# Patient Record
Sex: Male | Born: 1940 | Race: White | Hispanic: No | Marital: Single | Smoking: Former smoker
Health system: Southern US, Community
[De-identification: ages and names within clinical notes are randomized; demographics above are authoritative.]

## PROBLEM LIST (undated history)

## (undated) DIAGNOSIS — C19 Malignant neoplasm of rectosigmoid junction: Secondary | ICD-10-CM

## (undated) DIAGNOSIS — G629 Polyneuropathy, unspecified: Secondary | ICD-10-CM

## (undated) DIAGNOSIS — J45909 Unspecified asthma, uncomplicated: Secondary | ICD-10-CM

## (undated) DIAGNOSIS — M1990 Unspecified osteoarthritis, unspecified site: Secondary | ICD-10-CM

## (undated) DIAGNOSIS — F32A Depression, unspecified: Secondary | ICD-10-CM

## (undated) DIAGNOSIS — F329 Major depressive disorder, single episode, unspecified: Secondary | ICD-10-CM

## (undated) HISTORY — DX: Depression, unspecified: F32.A

## (undated) HISTORY — DX: Polyneuropathy, unspecified: G62.9

## (undated) HISTORY — DX: Major depressive disorder, single episode, unspecified: F32.9

## (undated) HISTORY — DX: Unspecified osteoarthritis, unspecified site: M19.90

## (undated) HISTORY — PX: COLON RESECTION: SHX5231

## (undated) HISTORY — DX: Unspecified asthma, uncomplicated: J45.909

## (undated) HISTORY — PX: TOTAL HIP ARTHROPLASTY: SHX124

## (undated) HISTORY — PX: FETAL SURGERY FOR CONGENITAL HERNIA: SHX1618

## (undated) HISTORY — DX: Malignant neoplasm of rectosigmoid junction: C19

## (undated) HISTORY — PX: APPENDECTOMY: SHX54

---

## 2009-02-15 ENCOUNTER — Encounter: Payer: Self-pay | Admitting: Gastroenterology

## 2009-02-23 ENCOUNTER — Ambulatory Visit: Payer: Self-pay | Admitting: Internal Medicine

## 2009-02-23 ENCOUNTER — Inpatient Hospital Stay (HOSPITAL_COMMUNITY): Admission: EM | Admit: 2009-02-23 | Discharge: 2009-02-24 | Payer: Self-pay | Admitting: Emergency Medicine

## 2009-02-23 ENCOUNTER — Encounter (INDEPENDENT_AMBULATORY_CARE_PROVIDER_SITE_OTHER): Payer: Self-pay | Admitting: *Deleted

## 2009-02-26 ENCOUNTER — Inpatient Hospital Stay: Payer: Self-pay | Admitting: Internal Medicine

## 2009-03-08 ENCOUNTER — Emergency Department: Payer: Self-pay | Admitting: Emergency Medicine

## 2009-05-02 ENCOUNTER — Ambulatory Visit: Payer: Self-pay | Admitting: Oncology

## 2009-05-10 LAB — CBC WITH DIFFERENTIAL/PLATELET
BASO%: 0.5 % (ref 0.0–2.0)
Eosinophils Absolute: 0.2 10*3/uL (ref 0.0–0.5)
HCT: 44.7 % (ref 38.4–49.9)
LYMPH%: 27.6 % (ref 14.0–49.0)
MONO#: 0.5 10*3/uL (ref 0.1–0.9)
NEUT#: 5.4 10*3/uL (ref 1.5–6.5)
Platelets: 287 10*3/uL (ref 140–400)
RBC: 4.69 10*6/uL (ref 4.20–5.82)
WBC: 8.3 10*3/uL (ref 4.0–10.3)
lymph#: 2.3 10*3/uL (ref 0.9–3.3)

## 2009-05-10 LAB — COMPREHENSIVE METABOLIC PANEL
Alkaline Phosphatase: 58 U/L (ref 39–117)
BUN: 13 mg/dL (ref 6–23)
CO2: 24 mEq/L (ref 19–32)
Creatinine, Ser: 0.86 mg/dL (ref 0.40–1.50)
Glucose, Bld: 113 mg/dL — ABNORMAL HIGH (ref 70–99)
Total Bilirubin: 0.6 mg/dL (ref 0.3–1.2)
Total Protein: 6.5 g/dL (ref 6.0–8.3)

## 2009-05-10 LAB — LACTATE DEHYDROGENASE: LDH: 99 U/L (ref 94–250)

## 2009-05-16 ENCOUNTER — Ambulatory Visit: Admission: RE | Admit: 2009-05-16 | Discharge: 2009-06-29 | Payer: Self-pay | Admitting: Radiation Oncology

## 2009-05-18 ENCOUNTER — Encounter (INDEPENDENT_AMBULATORY_CARE_PROVIDER_SITE_OTHER): Payer: Self-pay | Admitting: *Deleted

## 2009-05-18 ENCOUNTER — Ambulatory Visit (HOSPITAL_COMMUNITY): Admission: RE | Admit: 2009-05-18 | Discharge: 2009-05-18 | Payer: Self-pay | Admitting: Oncology

## 2009-05-22 ENCOUNTER — Ambulatory Visit (HOSPITAL_COMMUNITY): Admission: RE | Admit: 2009-05-22 | Discharge: 2009-05-22 | Payer: Self-pay | Admitting: Oncology

## 2009-05-22 ENCOUNTER — Encounter (INDEPENDENT_AMBULATORY_CARE_PROVIDER_SITE_OTHER): Payer: Self-pay | Admitting: *Deleted

## 2009-05-23 LAB — LACTATE DEHYDROGENASE: LDH: 139 U/L (ref 94–250)

## 2009-05-23 LAB — COMPREHENSIVE METABOLIC PANEL
ALT: 9 U/L (ref 0–53)
AST: 11 U/L (ref 0–37)
Alkaline Phosphatase: 62 U/L (ref 39–117)
CO2: 23 mEq/L (ref 19–32)
Creatinine, Ser: 0.74 mg/dL (ref 0.40–1.50)
Sodium: 138 mEq/L (ref 135–145)
Total Bilirubin: 0.5 mg/dL (ref 0.3–1.2)
Total Protein: 6.6 g/dL (ref 6.0–8.3)

## 2009-05-23 LAB — CBC WITH DIFFERENTIAL/PLATELET
Basophils Absolute: 0.1 10*3/uL (ref 0.0–0.1)
Eosinophils Absolute: 0.3 10*3/uL (ref 0.0–0.5)
HGB: 16.4 g/dL (ref 13.0–17.1)
LYMPH%: 23.3 % (ref 14.0–49.0)
MONO#: 0.6 10*3/uL (ref 0.1–0.9)
NEUT#: 5.9 10*3/uL (ref 1.5–6.5)
Platelets: 213 10*3/uL (ref 140–400)
RBC: 5.13 10*6/uL (ref 4.20–5.82)
RDW: 13.9 % (ref 11.0–14.6)
WBC: 8.9 10*3/uL (ref 4.0–10.3)

## 2009-05-30 LAB — CBC WITH DIFFERENTIAL/PLATELET
BASO%: 0.6 % (ref 0.0–2.0)
Basophils Absolute: 0 10*3/uL (ref 0.0–0.1)
EOS%: 8.5 % — ABNORMAL HIGH (ref 0.0–7.0)
HCT: 47.6 % (ref 38.4–49.9)
HGB: 16.7 g/dL (ref 13.0–17.1)
LYMPH%: 34.5 % (ref 14.0–49.0)
MCH: 32 pg (ref 27.2–33.4)
MCHC: 35.1 g/dL (ref 32.0–36.0)
MCV: 91.2 fL (ref 79.3–98.0)
NEUT%: 50 % (ref 39.0–75.0)
Platelets: 180 10*3/uL (ref 140–400)
lymph#: 2.5 10*3/uL (ref 0.9–3.3)

## 2009-06-04 ENCOUNTER — Ambulatory Visit: Payer: Self-pay | Admitting: Oncology

## 2009-06-06 LAB — COMPREHENSIVE METABOLIC PANEL
Albumin: 4.2 g/dL (ref 3.5–5.2)
Alkaline Phosphatase: 60 U/L (ref 39–117)
BUN: 11 mg/dL (ref 6–23)
Calcium: 9.3 mg/dL (ref 8.4–10.5)
Creatinine, Ser: 0.91 mg/dL (ref 0.40–1.50)
Glucose, Bld: 102 mg/dL — ABNORMAL HIGH (ref 70–99)
Potassium: 4.3 mEq/L (ref 3.5–5.3)

## 2009-06-06 LAB — CBC WITH DIFFERENTIAL/PLATELET
Basophils Absolute: 0.1 10*3/uL (ref 0.0–0.1)
Eosinophils Absolute: 0.2 10*3/uL (ref 0.0–0.5)
HGB: 16.9 g/dL (ref 13.0–17.1)
MONO#: 0.7 10*3/uL (ref 0.1–0.9)
NEUT#: 6.4 10*3/uL (ref 1.5–6.5)
Platelets: 184 10*3/uL (ref 140–400)
RBC: 5.22 10*6/uL (ref 4.20–5.82)
RDW: 14.1 % (ref 11.0–14.6)
WBC: 9.5 10*3/uL (ref 4.0–10.3)
nRBC: 0 % (ref 0–0)

## 2009-06-20 LAB — CBC WITH DIFFERENTIAL/PLATELET
BASO%: 0.3 % (ref 0.0–2.0)
Eosinophils Absolute: 0.1 10*3/uL (ref 0.0–0.5)
HCT: 44.4 % (ref 38.4–49.9)
LYMPH%: 30.4 % (ref 14.0–49.0)
MCHC: 35.2 g/dL (ref 32.0–36.0)
MONO#: 0.5 10*3/uL (ref 0.1–0.9)
NEUT#: 4.5 10*3/uL (ref 1.5–6.5)
NEUT%: 60.7 % (ref 39.0–75.0)
Platelets: 158 10*3/uL (ref 140–400)
RBC: 4.7 10*6/uL (ref 4.20–5.82)
WBC: 7.5 10*3/uL (ref 4.0–10.3)
lymph#: 2.3 10*3/uL (ref 0.9–3.3)

## 2009-06-20 LAB — COMPREHENSIVE METABOLIC PANEL
Alkaline Phosphatase: 63 U/L (ref 39–117)
BUN: 9 mg/dL (ref 6–23)
CO2: 24 mEq/L (ref 19–32)
Glucose, Bld: 115 mg/dL — ABNORMAL HIGH (ref 70–99)
Total Bilirubin: 0.4 mg/dL (ref 0.3–1.2)
Total Protein: 6.1 g/dL (ref 6.0–8.3)

## 2009-06-20 LAB — LACTATE DEHYDROGENASE: LDH: 135 U/L (ref 94–250)

## 2009-07-02 ENCOUNTER — Ambulatory Visit: Payer: Self-pay | Admitting: Oncology

## 2009-07-04 ENCOUNTER — Encounter (INDEPENDENT_AMBULATORY_CARE_PROVIDER_SITE_OTHER): Payer: Self-pay | Admitting: *Deleted

## 2009-07-04 LAB — CBC WITH DIFFERENTIAL/PLATELET
Basophils Absolute: 0 10*3/uL (ref 0.0–0.1)
Eosinophils Absolute: 0.1 10*3/uL (ref 0.0–0.5)
HCT: 44.1 % (ref 38.4–49.9)
HGB: 15.4 g/dL (ref 13.0–17.1)
LYMPH%: 28.3 % (ref 14.0–49.0)
MONO#: 0.4 10*3/uL (ref 0.1–0.9)
NEUT%: 61.3 % (ref 39.0–75.0)
Platelets: 90 10*3/uL — ABNORMAL LOW (ref 140–400)
WBC: 5.3 10*3/uL (ref 4.0–10.3)
lymph#: 1.5 10*3/uL (ref 0.9–3.3)

## 2009-07-10 LAB — COMPREHENSIVE METABOLIC PANEL
AST: 17 U/L (ref 0–37)
Albumin: 3.9 g/dL (ref 3.5–5.2)
BUN: 10 mg/dL (ref 6–23)
CO2: 24 mEq/L (ref 19–32)
Calcium: 9.5 mg/dL (ref 8.4–10.5)
Chloride: 102 mEq/L (ref 96–112)
Creatinine, Ser: 0.89 mg/dL (ref 0.40–1.50)
Glucose, Bld: 98 mg/dL (ref 70–99)
Potassium: 3.9 mEq/L (ref 3.5–5.3)

## 2009-07-10 LAB — CBC WITH DIFFERENTIAL/PLATELET
Eosinophils Absolute: 0.1 10*3/uL (ref 0.0–0.5)
MCV: 95.4 fL (ref 79.3–98.0)
MONO%: 11.3 % (ref 0.0–14.0)
NEUT#: 2.3 10*3/uL (ref 1.5–6.5)
RBC: 4.83 10*6/uL (ref 4.20–5.82)
RDW: 16 % — ABNORMAL HIGH (ref 11.0–14.6)
WBC: 4.6 10*3/uL (ref 4.0–10.3)
lymph#: 1.6 10*3/uL (ref 0.9–3.3)

## 2009-07-10 LAB — LACTATE DEHYDROGENASE: LDH: 140 U/L (ref 94–250)

## 2009-07-24 LAB — CBC WITH DIFFERENTIAL/PLATELET
Basophils Absolute: 0 10*3/uL (ref 0.0–0.1)
EOS%: 1.6 % (ref 0.0–7.0)
Eosinophils Absolute: 0.1 10*3/uL (ref 0.0–0.5)
HGB: 15.8 g/dL (ref 13.0–17.1)
LYMPH%: 25.9 % (ref 14.0–49.0)
MCH: 34.3 pg — ABNORMAL HIGH (ref 27.2–33.4)
MCV: 95.7 fL (ref 79.3–98.0)
MONO%: 8.3 % (ref 0.0–14.0)
NEUT#: 4 10*3/uL (ref 1.5–6.5)
Platelets: 115 10*3/uL — ABNORMAL LOW (ref 140–400)
RDW: 16.7 % — ABNORMAL HIGH (ref 11.0–14.6)

## 2009-07-24 LAB — COMPREHENSIVE METABOLIC PANEL
AST: 21 U/L (ref 0–37)
Albumin: 3.9 g/dL (ref 3.5–5.2)
Alkaline Phosphatase: 63 U/L (ref 39–117)
BUN: 11 mg/dL (ref 6–23)
Potassium: 4.1 mEq/L (ref 3.5–5.3)
Sodium: 137 mEq/L (ref 135–145)
Total Bilirubin: 0.8 mg/dL (ref 0.3–1.2)
Total Protein: 6.4 g/dL (ref 6.0–8.3)

## 2009-07-31 LAB — CBC WITH DIFFERENTIAL/PLATELET
BASO%: 0.5 % (ref 0.0–2.0)
EOS%: 2.9 % (ref 0.0–7.0)
LYMPH%: 49.9 % — ABNORMAL HIGH (ref 14.0–49.0)
MCH: 34.4 pg — ABNORMAL HIGH (ref 27.2–33.4)
MCHC: 35.3 g/dL (ref 32.0–36.0)
MCV: 97.3 fL (ref 79.3–98.0)
MONO%: 9 % (ref 0.0–14.0)
NEUT#: 2.1 10*3/uL (ref 1.5–6.5)
Platelets: 164 10*3/uL (ref 140–400)
RBC: 4.42 10*6/uL (ref 4.20–5.82)
RDW: 16.7 % — ABNORMAL HIGH (ref 11.0–14.6)

## 2009-08-01 ENCOUNTER — Ambulatory Visit: Payer: Self-pay | Admitting: Gastroenterology

## 2009-08-01 DIAGNOSIS — Z91013 Allergy to seafood: Secondary | ICD-10-CM | POA: Insufficient documentation

## 2009-08-01 DIAGNOSIS — R198 Other specified symptoms and signs involving the digestive system and abdomen: Secondary | ICD-10-CM

## 2009-08-01 DIAGNOSIS — Z85038 Personal history of other malignant neoplasm of large intestine: Secondary | ICD-10-CM | POA: Insufficient documentation

## 2009-08-03 ENCOUNTER — Ambulatory Visit: Payer: Self-pay | Admitting: Oncology

## 2009-08-07 ENCOUNTER — Encounter: Payer: Self-pay | Admitting: Gastroenterology

## 2009-08-07 LAB — COMPREHENSIVE METABOLIC PANEL
AST: 14 U/L (ref 0–37)
Albumin: 3.2 g/dL — ABNORMAL LOW (ref 3.5–5.2)
Alkaline Phosphatase: 53 U/L (ref 39–117)
BUN: 10 mg/dL (ref 6–23)
Creatinine, Ser: 0.72 mg/dL (ref 0.40–1.50)
Potassium: 3.6 mEq/L (ref 3.5–5.3)
Total Bilirubin: 0.6 mg/dL (ref 0.3–1.2)

## 2009-08-07 LAB — CBC WITH DIFFERENTIAL/PLATELET
Basophils Absolute: 0 10*3/uL (ref 0.0–0.1)
Eosinophils Absolute: 0.1 10*3/uL (ref 0.0–0.5)
HCT: 45.8 % (ref 38.4–49.9)
HGB: 15.9 g/dL (ref 13.0–17.1)
LYMPH%: 32.9 % (ref 14.0–49.0)
MCV: 98.9 fL — ABNORMAL HIGH (ref 79.3–98.0)
MONO#: 0.5 10*3/uL (ref 0.1–0.9)
MONO%: 8.1 % (ref 0.0–14.0)
NEUT#: 3.5 10*3/uL (ref 1.5–6.5)
NEUT%: 56.5 % (ref 39.0–75.0)
Platelets: 149 10*3/uL (ref 140–400)
WBC: 6.2 10*3/uL (ref 4.0–10.3)

## 2009-08-21 ENCOUNTER — Encounter: Payer: Self-pay | Admitting: Gastroenterology

## 2009-08-21 LAB — CBC WITH DIFFERENTIAL/PLATELET
Basophils Absolute: 0 10*3/uL (ref 0.0–0.1)
EOS%: 2.5 % (ref 0.0–7.0)
Eosinophils Absolute: 0.2 10*3/uL (ref 0.0–0.5)
HCT: 43.2 % (ref 38.4–49.9)
HGB: 15.3 g/dL (ref 13.0–17.1)
MCH: 35 pg — ABNORMAL HIGH (ref 27.2–33.4)
NEUT%: 60.2 % (ref 39.0–75.0)
lymph#: 2.3 10*3/uL (ref 0.9–3.3)

## 2009-08-21 LAB — COMPREHENSIVE METABOLIC PANEL
BUN: 10 mg/dL (ref 6–23)
CO2: 21 mEq/L (ref 19–32)
Calcium: 9.5 mg/dL (ref 8.4–10.5)
Chloride: 103 mEq/L (ref 96–112)
Creatinine, Ser: 0.75 mg/dL (ref 0.40–1.50)
Glucose, Bld: 97 mg/dL (ref 70–99)

## 2009-08-28 ENCOUNTER — Ambulatory Visit: Payer: Self-pay | Admitting: Gastroenterology

## 2009-09-03 ENCOUNTER — Ambulatory Visit: Payer: Self-pay | Admitting: Oncology

## 2009-09-04 ENCOUNTER — Encounter: Payer: Self-pay | Admitting: Gastroenterology

## 2009-09-04 LAB — CBC WITH DIFFERENTIAL/PLATELET
BASO%: 0.7 % (ref 0.0–2.0)
HCT: 46.6 % (ref 38.4–49.9)
MCHC: 34.3 g/dL (ref 32.0–36.0)
MONO#: 0.5 10*3/uL (ref 0.1–0.9)
NEUT%: 63.5 % (ref 39.0–75.0)
RDW: 18.6 % — ABNORMAL HIGH (ref 11.0–14.6)
WBC: 5.6 10*3/uL (ref 4.0–10.3)
lymph#: 1.3 10*3/uL (ref 0.9–3.3)

## 2009-09-04 LAB — COMPREHENSIVE METABOLIC PANEL
CO2: 23 mEq/L (ref 19–32)
Creatinine, Ser: 0.81 mg/dL (ref 0.40–1.50)
Glucose, Bld: 158 mg/dL — ABNORMAL HIGH (ref 70–99)
Sodium: 136 mEq/L (ref 135–145)
Total Bilirubin: 0.7 mg/dL (ref 0.3–1.2)
Total Protein: 6 g/dL (ref 6.0–8.3)

## 2009-09-04 LAB — LACTATE DEHYDROGENASE: LDH: 160 U/L (ref 94–250)

## 2009-09-11 ENCOUNTER — Encounter: Payer: Self-pay | Admitting: Gastroenterology

## 2009-09-18 ENCOUNTER — Encounter: Payer: Self-pay | Admitting: Gastroenterology

## 2009-09-18 LAB — CBC WITH DIFFERENTIAL/PLATELET
BASO%: 0.3 % (ref 0.0–2.0)
EOS%: 2.1 % (ref 0.0–7.0)
MCH: 36.3 pg — ABNORMAL HIGH (ref 27.2–33.4)
MCHC: 35.5 g/dL (ref 32.0–36.0)
MCV: 102.3 fL — ABNORMAL HIGH (ref 79.3–98.0)
MONO%: 12.4 % (ref 0.0–14.0)
RDW: 16.2 % — ABNORMAL HIGH (ref 11.0–14.6)
lymph#: 1.3 10*3/uL (ref 0.9–3.3)

## 2009-09-18 LAB — COMPREHENSIVE METABOLIC PANEL
ALT: 17 U/L (ref 0–53)
AST: 35 U/L (ref 0–37)
Albumin: 3.7 g/dL (ref 3.5–5.2)
BUN: 10 mg/dL (ref 6–23)
Calcium: 8.9 mg/dL (ref 8.4–10.5)
Chloride: 106 mEq/L (ref 96–112)
Potassium: 4 mEq/L (ref 3.5–5.3)
Sodium: 137 mEq/L (ref 135–145)
Total Protein: 6.1 g/dL (ref 6.0–8.3)

## 2009-09-18 LAB — LACTATE DEHYDROGENASE: LDH: 203 U/L (ref 94–250)

## 2009-09-24 LAB — CBC WITH DIFFERENTIAL/PLATELET
BASO%: 0.9 % (ref 0.0–2.0)
HCT: 43.4 % (ref 38.4–49.9)
MCHC: 35.5 g/dL (ref 32.0–36.0)
MONO#: 0.3 10*3/uL (ref 0.1–0.9)
NEUT%: 68.7 % (ref 39.0–75.0)
lymph#: 0.9 10*3/uL (ref 0.9–3.3)

## 2009-10-01 ENCOUNTER — Encounter: Payer: Self-pay | Admitting: Gastroenterology

## 2009-10-01 LAB — CBC WITH DIFFERENTIAL/PLATELET
EOS%: 2.4 % (ref 0.0–7.0)
Eosinophils Absolute: 0.1 10*3/uL (ref 0.0–0.5)
LYMPH%: 29 % (ref 14.0–49.0)
MCH: 36.5 pg — ABNORMAL HIGH (ref 27.2–33.4)
MCHC: 34.8 g/dL (ref 32.0–36.0)
MCV: 104.8 fL — ABNORMAL HIGH (ref 79.3–98.0)
MONO%: 10.2 % (ref 0.0–14.0)
NEUT#: 3.4 10*3/uL (ref 1.5–6.5)
Platelets: 102 10*3/uL — ABNORMAL LOW (ref 140–400)
RBC: 4.33 10*6/uL (ref 4.20–5.82)

## 2009-10-01 LAB — COMPREHENSIVE METABOLIC PANEL
Alkaline Phosphatase: 93 U/L (ref 39–117)
BUN: 13 mg/dL (ref 6–23)
Glucose, Bld: 119 mg/dL — ABNORMAL HIGH (ref 70–99)
Sodium: 138 mEq/L (ref 135–145)
Total Bilirubin: 0.6 mg/dL (ref 0.3–1.2)
Total Protein: 6.5 g/dL (ref 6.0–8.3)

## 2009-10-03 ENCOUNTER — Ambulatory Visit: Payer: Self-pay | Admitting: Oncology

## 2009-10-08 LAB — CBC WITH DIFFERENTIAL/PLATELET
Eosinophils Absolute: 0.1 10*3/uL (ref 0.0–0.5)
HCT: 42.3 % (ref 38.4–49.9)
LYMPH%: 46.3 % (ref 14.0–49.0)
MONO#: 0.3 10*3/uL (ref 0.1–0.9)
NEUT#: 1.8 10*3/uL (ref 1.5–6.5)
NEUT%: 43.9 % (ref 39.0–75.0)
Platelets: 96 10*3/uL — ABNORMAL LOW (ref 140–400)
WBC: 4.1 10*3/uL (ref 4.0–10.3)

## 2009-10-15 ENCOUNTER — Encounter: Payer: Self-pay | Admitting: Gastroenterology

## 2009-10-15 LAB — CBC WITH DIFFERENTIAL/PLATELET
BASO%: 0.7 % (ref 0.0–2.0)
Basophils Absolute: 0 10*3/uL (ref 0.0–0.1)
HCT: 43.8 % (ref 38.4–49.9)
HGB: 15.5 g/dL (ref 13.0–17.1)
MCHC: 35.3 g/dL (ref 32.0–36.0)
MONO#: 0.6 10*3/uL (ref 0.1–0.9)
NEUT%: 59.5 % (ref 39.0–75.0)
WBC: 5.6 10*3/uL (ref 4.0–10.3)
lymph#: 1.4 10*3/uL (ref 0.9–3.3)

## 2009-10-15 LAB — COMPREHENSIVE METABOLIC PANEL
BUN: 10 mg/dL (ref 6–23)
CO2: 22 mEq/L (ref 19–32)
Creatinine, Ser: 0.8 mg/dL (ref 0.40–1.50)
Glucose, Bld: 92 mg/dL (ref 70–99)
Total Bilirubin: 0.4 mg/dL (ref 0.3–1.2)

## 2009-10-15 LAB — LACTATE DEHYDROGENASE: LDH: 151 U/L (ref 94–250)

## 2009-10-23 LAB — CBC WITH DIFFERENTIAL/PLATELET
Basophils Absolute: 0 10*3/uL (ref 0.0–0.1)
Eosinophils Absolute: 0.2 10*3/uL (ref 0.0–0.5)
HGB: 14.8 g/dL (ref 13.0–17.1)
MCV: 103.4 fL — ABNORMAL HIGH (ref 79.3–98.0)
MONO#: 0.5 10*3/uL (ref 0.1–0.9)
MONO%: 9.4 % (ref 0.0–14.0)
RBC: 4.09 10*6/uL — ABNORMAL LOW (ref 4.20–5.82)
RDW: 15.1 % — ABNORMAL HIGH (ref 11.0–14.6)
lymph#: 2.1 10*3/uL (ref 0.9–3.3)
nRBC: 0 % (ref 0–0)

## 2009-10-29 ENCOUNTER — Encounter: Payer: Self-pay | Admitting: Gastroenterology

## 2009-10-29 LAB — CBC WITH DIFFERENTIAL/PLATELET
BASO%: 0.5 % (ref 0.0–2.0)
Eosinophils Absolute: 0.1 10*3/uL (ref 0.0–0.5)
HCT: 44.1 % (ref 38.4–49.9)
MCHC: 34.9 g/dL (ref 32.0–36.0)
MONO#: 0.7 10*3/uL (ref 0.1–0.9)
NEUT#: 3.2 10*3/uL (ref 1.5–6.5)
Platelets: 119 10*3/uL — ABNORMAL LOW (ref 140–400)
RBC: 4.19 10*6/uL — ABNORMAL LOW (ref 4.20–5.82)
WBC: 5.6 10*3/uL (ref 4.0–10.3)
lymph#: 1.5 10*3/uL (ref 0.9–3.3)

## 2009-10-29 LAB — COMPREHENSIVE METABOLIC PANEL
BUN: 13 mg/dL (ref 6–23)
CO2: 24 mEq/L (ref 19–32)
Calcium: 9.4 mg/dL (ref 8.4–10.5)
Chloride: 106 mEq/L (ref 96–112)
Creatinine, Ser: 0.76 mg/dL (ref 0.40–1.50)

## 2009-10-29 LAB — CEA: CEA: 1.2 ng/mL (ref 0.0–5.0)

## 2009-11-12 ENCOUNTER — Ambulatory Visit: Admission: RE | Admit: 2009-11-12 | Discharge: 2010-01-15 | Payer: Self-pay | Admitting: Radiation Oncology

## 2009-11-13 ENCOUNTER — Encounter: Payer: Self-pay | Admitting: Gastroenterology

## 2009-11-13 ENCOUNTER — Ambulatory Visit (HOSPITAL_COMMUNITY): Admission: RE | Admit: 2009-11-13 | Discharge: 2009-11-13 | Payer: Self-pay | Admitting: Oncology

## 2009-11-27 ENCOUNTER — Ambulatory Visit: Payer: Self-pay | Admitting: Oncology

## 2009-12-04 ENCOUNTER — Encounter: Payer: Self-pay | Admitting: Gastroenterology

## 2009-12-04 LAB — COMPREHENSIVE METABOLIC PANEL
AST: 21 U/L (ref 0–37)
Albumin: 3.4 g/dL — ABNORMAL LOW (ref 3.5–5.2)
BUN: 10 mg/dL (ref 6–23)
Calcium: 9.1 mg/dL (ref 8.4–10.5)
Chloride: 105 mEq/L (ref 96–112)
Creatinine, Ser: 0.72 mg/dL (ref 0.40–1.50)
Glucose, Bld: 109 mg/dL — ABNORMAL HIGH (ref 70–99)
Potassium: 3.7 mEq/L (ref 3.5–5.3)

## 2009-12-04 LAB — CBC WITH DIFFERENTIAL/PLATELET
Basophils Absolute: 0 10*3/uL (ref 0.0–0.1)
EOS%: 2 % (ref 0.0–7.0)
Eosinophils Absolute: 0.1 10*3/uL (ref 0.0–0.5)
HCT: 42.3 % (ref 38.4–49.9)
HGB: 15 g/dL (ref 13.0–17.1)
MCH: 37.7 pg — ABNORMAL HIGH (ref 27.2–33.4)
MCV: 106.5 fL — ABNORMAL HIGH (ref 79.3–98.0)
MONO%: 9.3 % (ref 0.0–14.0)
NEUT%: 66.2 % (ref 39.0–75.0)

## 2009-12-04 LAB — LACTATE DEHYDROGENASE: LDH: 120 U/L (ref 94–250)

## 2009-12-04 LAB — CEA: CEA: 1.3 ng/mL (ref 0.0–5.0)

## 2009-12-12 LAB — CBC WITH DIFFERENTIAL/PLATELET
BASO%: 0.8 % (ref 0.0–2.0)
EOS%: 3.2 % (ref 0.0–7.0)
LYMPH%: 18.1 % (ref 14.0–49.0)
MCHC: 34.4 g/dL (ref 32.0–36.0)
MCV: 104.6 fL — ABNORMAL HIGH (ref 79.3–98.0)
MONO%: 6.3 % (ref 0.0–14.0)
NEUT#: 3.4 10*3/uL (ref 1.5–6.5)
Platelets: 125 10*3/uL — ABNORMAL LOW (ref 140–400)
RBC: 4.11 10*6/uL — ABNORMAL LOW (ref 4.20–5.82)
nRBC: 0 % (ref 0–0)

## 2009-12-18 ENCOUNTER — Encounter: Payer: Self-pay | Admitting: Gastroenterology

## 2009-12-18 LAB — CBC WITH DIFFERENTIAL/PLATELET
Basophils Absolute: 0 10*3/uL (ref 0.0–0.1)
EOS%: 3.6 % (ref 0.0–7.0)
HCT: 44.8 % (ref 38.4–49.9)
HGB: 15 g/dL (ref 13.0–17.1)
MCH: 35.8 pg — ABNORMAL HIGH (ref 27.2–33.4)
NEUT%: 77.1 % — ABNORMAL HIGH (ref 39.0–75.0)
lymph#: 0.6 10*3/uL — ABNORMAL LOW (ref 0.9–3.3)

## 2009-12-18 LAB — COMPREHENSIVE METABOLIC PANEL
Albumin: 3.8 g/dL (ref 3.5–5.2)
Alkaline Phosphatase: 91 U/L (ref 39–117)
BUN: 8 mg/dL (ref 6–23)
Calcium: 9.1 mg/dL (ref 8.4–10.5)
Chloride: 103 mEq/L (ref 96–112)
Creatinine, Ser: 0.81 mg/dL (ref 0.40–1.50)
Glucose, Bld: 93 mg/dL (ref 70–99)
Potassium: 4.2 mEq/L (ref 3.5–5.3)
Sodium: 137 mEq/L (ref 135–145)

## 2009-12-18 LAB — LACTATE DEHYDROGENASE: LDH: 148 U/L (ref 94–250)

## 2009-12-27 ENCOUNTER — Encounter: Payer: Self-pay | Admitting: Gastroenterology

## 2009-12-27 ENCOUNTER — Ambulatory Visit: Payer: Self-pay | Admitting: Oncology

## 2009-12-27 LAB — CBC WITH DIFFERENTIAL/PLATELET
Basophils Absolute: 0 10*3/uL (ref 0.0–0.1)
Eosinophils Absolute: 0.2 10*3/uL (ref 0.0–0.5)
HGB: 15.4 g/dL (ref 13.0–17.1)
MCV: 103.5 fL — ABNORMAL HIGH (ref 79.3–98.0)
MONO%: 7.5 % (ref 0.0–14.0)
NEUT#: 4.9 10*3/uL (ref 1.5–6.5)
RBC: 4.24 10*6/uL (ref 4.20–5.82)
RDW: 14.6 % (ref 11.0–14.6)
WBC: 6.2 10*3/uL (ref 4.0–10.3)
lymph#: 0.6 10*3/uL — ABNORMAL LOW (ref 0.9–3.3)

## 2009-12-31 LAB — CBC WITH DIFFERENTIAL/PLATELET
Basophils Absolute: 0 10*3/uL (ref 0.0–0.1)
EOS%: 3.5 % (ref 0.0–7.0)
Eosinophils Absolute: 0.2 10*3/uL (ref 0.0–0.5)
LYMPH%: 7.4 % — ABNORMAL LOW (ref 14.0–49.0)
MCH: 36.9 pg — ABNORMAL HIGH (ref 27.2–33.4)
MCV: 102.6 fL — ABNORMAL HIGH (ref 79.3–98.0)
MONO%: 7.6 % (ref 0.0–14.0)
NEUT#: 4.8 10*3/uL (ref 1.5–6.5)
Platelets: 137 10*3/uL — ABNORMAL LOW (ref 140–400)
RBC: 4.2 10*6/uL (ref 4.20–5.82)
WBC: 6 10*3/uL (ref 4.0–10.3)
nRBC: 0 % (ref 0–0)

## 2010-01-03 LAB — COMPREHENSIVE METABOLIC PANEL
ALT: 16 U/L (ref 0–53)
AST: 18 U/L (ref 0–37)
Alkaline Phosphatase: 86 U/L (ref 39–117)
CO2: 23 mEq/L (ref 19–32)
Chloride: 103 mEq/L (ref 96–112)
Creatinine, Ser: 0.73 mg/dL (ref 0.40–1.50)
Glucose, Bld: 90 mg/dL (ref 70–99)
Total Bilirubin: 0.7 mg/dL (ref 0.3–1.2)

## 2010-01-03 LAB — CBC WITH DIFFERENTIAL/PLATELET
Basophils Absolute: 0 10*3/uL (ref 0.0–0.1)
EOS%: 2.3 % (ref 0.0–7.0)
HCT: 44.7 % (ref 38.4–49.9)
HGB: 15.9 g/dL (ref 13.0–17.1)
MCH: 36.7 pg — ABNORMAL HIGH (ref 27.2–33.4)
MCV: 103.2 fL — ABNORMAL HIGH (ref 79.3–98.0)
NEUT%: 79.5 % — ABNORMAL HIGH (ref 39.0–75.0)
lymph#: 0.4 10*3/uL — ABNORMAL LOW (ref 0.9–3.3)

## 2010-01-10 LAB — CBC WITH DIFFERENTIAL/PLATELET
Eosinophils Absolute: 0.2 10*3/uL (ref 0.0–0.5)
MCV: 103.2 fL — ABNORMAL HIGH (ref 79.3–98.0)
MONO#: 0.6 10*3/uL (ref 0.1–0.9)
Platelets: 143 10*3/uL (ref 140–400)
RBC: 4.32 10*6/uL (ref 4.20–5.82)
RDW: 14.7 % — ABNORMAL HIGH (ref 11.0–14.6)
lymph#: 0.6 10*3/uL — ABNORMAL LOW (ref 0.9–3.3)
nRBC: 0 % (ref 0–0)

## 2010-01-10 LAB — COMPREHENSIVE METABOLIC PANEL
AST: 17 U/L (ref 0–37)
Albumin: 3.9 g/dL (ref 3.5–5.2)
Alkaline Phosphatase: 80 U/L (ref 39–117)
BUN: 13 mg/dL (ref 6–23)
Creatinine, Ser: 0.81 mg/dL (ref 0.40–1.50)
Potassium: 4.2 mEq/L (ref 3.5–5.3)

## 2010-01-15 ENCOUNTER — Encounter: Payer: Self-pay | Admitting: Gastroenterology

## 2010-01-22 ENCOUNTER — Encounter: Payer: Self-pay | Admitting: Gastroenterology

## 2010-01-29 ENCOUNTER — Ambulatory Visit: Payer: Self-pay | Admitting: Oncology

## 2010-01-31 LAB — CBC WITH DIFFERENTIAL/PLATELET
Basophils Absolute: 0 10*3/uL (ref 0.0–0.1)
Eosinophils Absolute: 0.1 10*3/uL (ref 0.0–0.5)
HCT: 43.4 % (ref 38.4–49.9)
HGB: 15.1 g/dL (ref 13.0–17.1)
LYMPH%: 10.3 % — ABNORMAL LOW (ref 14.0–49.0)
MONO#: 0.4 10*3/uL (ref 0.1–0.9)
NEUT#: 4.1 10*3/uL (ref 1.5–6.5)
NEUT%: 78.9 % — ABNORMAL HIGH (ref 39.0–75.0)
Platelets: 192 10*3/uL (ref 140–400)
WBC: 5.3 10*3/uL (ref 4.0–10.3)
lymph#: 0.5 10*3/uL — ABNORMAL LOW (ref 0.9–3.3)

## 2010-01-31 LAB — COMPREHENSIVE METABOLIC PANEL
ALT: 12 U/L (ref 0–53)
AST: 17 U/L (ref 0–37)
Albumin: 4.2 g/dL (ref 3.5–5.2)
Alkaline Phosphatase: 89 U/L (ref 39–117)
Calcium: 9.2 mg/dL (ref 8.4–10.5)
Chloride: 104 mEq/L (ref 96–112)
Creatinine, Ser: 0.76 mg/dL (ref 0.40–1.50)
Potassium: 4 mEq/L (ref 3.5–5.3)

## 2010-03-01 ENCOUNTER — Ambulatory Visit: Payer: Self-pay | Admitting: Oncology

## 2010-04-12 ENCOUNTER — Ambulatory Visit: Payer: Self-pay | Admitting: Oncology

## 2010-04-12 LAB — CBC WITH DIFFERENTIAL/PLATELET
BASO%: 0.1 % (ref 0.0–2.0)
Basophils Absolute: 0 10*3/uL (ref 0.0–0.1)
Eosinophils Absolute: 0.2 10*3/uL (ref 0.0–0.5)
HCT: 44.8 % (ref 38.4–49.9)
HGB: 15.3 g/dL (ref 13.0–17.1)
MONO#: 0.5 10*3/uL (ref 0.1–0.9)
NEUT%: 73.7 % (ref 39.0–75.0)
WBC: 5.6 10*3/uL (ref 4.0–10.3)
lymph#: 0.8 10*3/uL — ABNORMAL LOW (ref 0.9–3.3)

## 2010-04-12 LAB — COMPREHENSIVE METABOLIC PANEL
AST: 16 U/L (ref 0–37)
Albumin: 3.5 g/dL (ref 3.5–5.2)
BUN: 13 mg/dL (ref 6–23)
CO2: 28 mEq/L (ref 19–32)
Calcium: 9.2 mg/dL (ref 8.4–10.5)
Chloride: 105 mEq/L (ref 96–112)
Glucose, Bld: 87 mg/dL (ref 70–99)
Potassium: 4.1 mEq/L (ref 3.5–5.3)

## 2010-05-21 NOTE — Letter (Signed)
Summary: [facility]  [facility]   Imported By: Lester [place] 01/16/2010 11:00:38  _____________________________________________________________________  External Attachment:    Type:   Image     Comment:   External Document

## 2010-05-21 NOTE — Assessment & Plan Note (Signed)
Summary: ABNORMAL BM'S/YF   History of Present Illness Visit Type: consult Primary GI MD: Melvia Heaps MD [facility] Requesting Provider: Kimberlee Nearing, MD Chief Complaint: bowel changes, colon cancer found in 2010 in [place], [place], had colectomy at [facility] in [place] History of Present Illness:   Albert Moore is a pleasant 70 year old white male referred at the request of Dr. Arline Asp and Dr. Clovis Riley for evaluation of change of bowel habits.  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  He was placed on FOLFOX in February.  Since surgery he has had erratic bowels.  At this point he may go a couple days without a bowel movement followed by multiple loose stools with incontinence.  He has extreme urgency.  There is no history of bleeding.  He is without abdominal pain.  He reports passing frequent and large amounts of flatus.   GI Review of Systems    Reports acid reflux, bloating, heartburn, and  weight loss.      Denies abdominal pain, belching, chest pain, dysphagia with liquids, dysphagia with solids, loss of appetite, nausea, vomiting, vomiting blood, and  weight gain.      Reports change in bowel habits, diarrhea, fecal incontinence, and  hemorrhoids.     Denies anal fissure, black tarry stools, constipation, diverticulosis, heme positive stool, irritable bowel syndrome, jaundice, light color stool, liver problems, rectal bleeding, and  rectal pain. Preventive Screening-Counseling & Management  Alcohol-Tobacco     Smoking Status: current      Drug Use:  no.      Current Medications (verified): 1)  Celexa 20 Mg Tabs (Citalopram Hydrobromide) .... Take 1 Tablet By Mouth Once A Day 2)  Flomax 0.4 Mg Caps (Tamsulosin Hcl) .... Take 1 Capsule By Mouth Once A Day 3)  Eloxatin 50 Mg/67ml Soln (Oxaliplatin) ....  Every Two Weeks At Chemotherapy 4)  Adrucil 50 Mg/ml Soln (Fluorouracil) .... Every Two Weeks At Chemotherapy  Allergies (verified): 1)  * Shellfish  Past History:  Past Medical History: Arthritis Asthma Colorectal Cancer Depression  Past Surgical History: Appendectomy Colon Resection 10/10 Hernia Surgery  Family History: Family History of Breast Cancer: Mother No FH of Colon Cancer: Family History of Pancreatic Cancer: Father-unsure type, thinks pancreatic  Social History: Widowed, 1 boy Retired Patient currently smokes.  >1PPD Alcohol Use - no Illicit Drug Use - no Smoking Status:  current Drug Use:  no  Review of Systems       The patient complains of arthritis/joint pain, back pain, fatigue, hearing problems, urination changes/pain, and vision changes.  The patient denies allergy/sinus, anemia, anxiety-new, blood in urine, breast changes/lumps, confusion, cough, coughing up blood, depression-new, fainting, fever, headaches-new, heart murmur, heart rhythm changes, itching, muscle pains/cramps, night sweats, nosebleeds, shortness of breath, skin rash, sleeping problems, sore throat, swelling of feet/legs, swollen lymph glands, thirst - excessive, urination - excessive, urine leakage, and voice change.         .All other systems were reviewed and were negative   Vital Signs:  Patient profile:   70 year old male Height:      73 inches Weight:      190 pounds BMI:     25.16 Pulse rate:   64 / minute Pulse rhythm:   regular BP sitting:   110 / 68  (left arm) Cuff size:  regular  Vitals Entered By: Francee Piccolo CMA Duncan Dull) (August 01, 2009 11:46 AM)  Physical Exam  Additional Exam:  He is a well-developed well-nourished male  skin: anicteric HEENT: normocephalic; PEERLA; no nasal or pharyngeal abnormalities neck: supple nodes: no cervical lymphadenopathy chest: clear to ausculatation and percussion heart: no murmurs, gallops, or rubs abd: soft,  nontender; BS normoactive; no abdominal masses, tenderness, organomegaly rectal: deferred ext: no cynanosis, clubbing, edema skeletal: no deformities neuro: oriented x 3; no focal abnormalities    Impression & Recommendations:  Problem # 1:  CHANGE IN BOWELS (ICD-787.99) Change in bowel habits could be a postoperative change.  An anastomotic stricture should be ruled out.  Recommendations #1 sigmoidoscopy Orders: Flex with Sedation (Flex w/Sed)  Problem # 2:  PERSONAL HISTORY MALIG NEOPLASM LARGE INTESTINE (ICD-V10.05) Assessment: Comment Only  Patient Instructions: 1)  Please come for your scheduled Flexible sigmoidoscopy on 08/28/09 @ 10:30 am. Your will need to arrive at 9:30 am for registration. 2)  Copy sent to : Dr.Donald Murinson 3)  The medication list was reviewed and reconciled.  All changed / newly prescribed medications were explained.  A complete medication list was provided to the patient / caregiver. 4)  Conscious Sedation brochure given.  5)  CC Dr. Adelene Amas

## 2010-05-21 NOTE — Letter (Signed)
[facility]   Imported By: Lennie Odor 09/07/2009 15:39:28  _____________________________________________________________________  External Attachment:    Type:   Image     Comment:   External Document

## 2010-05-21 NOTE — Letter (Signed)
Summary: Patient Notice- Polyp Results  [facility]  [address] [place], [place] [address]   Phone: [phone]  Fax: [phone]        Aug 30, 2009 MRN: [id number]    [name] [address] [place], [place]  [address]    Dear Mr. BENTSON,  I am pleased to inform you that the colon polyp(s) removed during your recent colonoscopy was (were) found to be benign (no cancer detected) upon pathologic examination.  I recommend you have a repeat colonoscopy examination in _6 months  to look for recurrent polyps, as having colon polyps increases your risk for having recurrent polyps or even colon cancer in the future.  Should you develop new or worsening symptoms of abdominal pain, bowel habit changes or bleeding from the rectum or bowels, please schedule an evaluation with either your primary care physician or with me.  Additional information/recommendations:  __ No further action with gastroenterology is needed at this time. Please      follow-up with your primary care physician for your other healthcare      needs.  __ Please call [phone] to schedule a return visit to review your      situation.  __ Please keep your follow-up visit as already scheduled.  __ Continue treatment plan as outlined the day of your exam.  Please call us if you are having persistent problems or have questions about your condition that have not been fully answered at this time.  Sincerely,  Louis Meckel MD  This letter has been electronically signed by your physician.   Appended Document: Patient Notice- Polyp Results letter mailed

## 2010-05-21 NOTE — Letter (Signed)
[facility]   Imported By: Sherian Rein 11/19/2009 13:45:15  _____________________________________________________________________  External Attachment:    Type:   Image     Comment:   External Document

## 2010-05-21 NOTE — Letter (Signed)
Summary: New Patient letter  [facility]  [address] [place], [place] [address]   Phone: [phone]  Fax: [phone]       07/04/2009 MRN: [id number]  Albert Moore [address] [place], [place]  [address]  Dear Albert Moore,  Welcome to the Gastroenterology Division at [facility].    You are scheduled to see Dr.  Arlyce Dice on 08-01-09 at 11:30am on the 3rd floor at [facility], [address]  We ask that you try to arrive at our office 15 minutes prior to your appointment time to allow for check-in.  We would like you to complete the enclosed self-administered evaluation form prior to your visit and bring it with you on the day of your appointment.  We will review it with you.  Also, please bring a complete list of all your medications or, if you prefer, bring the medication bottles and we will list them.  Please bring your insurance card so that we may make a copy of it.  If your insurance requires a referral to see a specialist, please bring your referral form from your primary care physician.  Co-payments are due at the time of your visit and may be paid by cash, check or credit card.     Your office visit will consist of a consult with your physician (includes a physical exam), any laboratory testing he/she may order, scheduling of any necessary diagnostic testing (e.g. x-ray, ultrasound, CT-scan), and scheduling of a procedure (e.g. Endoscopy, Colonoscopy) if required.  Please allow enough time on your schedule to allow for any/all of these possibilities.    If you cannot keep your appointment, please call [phone] to cancel or reschedule prior to your appointment date.  This allows [place] the opportunity to schedule an appointment for another patient in need of care.  If you do not cancel or reschedule by 5 p.m. the business day prior to your appointment date, you will be charged a $50.00 late cancellation/no-show fee.    Thank you for choosing  [facility] for your medical needs.  We appreciate the opportunity to care for you.  Please visit [place] at our website  to learn more about our practice.                     Sincerely,                                                             The Gastroenterology Division

## 2010-05-21 NOTE — Letter (Signed)
Summary: [facility]  [address] [place], [place] [address]   Phone: [phone]  Fax: [phone]       Albert Moore    31-Mar-1941    MRN: [id number]        Procedure Day /Date: Tuesday 08/28/09     Arrival Time: 9:30 AM     Procedure Time: 10:30 AM     Location of Procedure:                    _x _  [facility] (4th Floor)  PREPARATION FOR FLEXIBLE SIGMOIDOSCOPY WITH MAGNESIUM CITRATE  Prior to the day before your procedure, purchase 2 Fleet Enemas from the laxative section of your drugstore.  _________________________________________________________________________________________________  THE DAY BEFORE YOUR PROCEDURE             DATE: 08/27/09      DAY: Monday  1.   Have a clear liquid dinner the night before your procedure.  2.   Do not drink anything colored red or purple.  Avoid juices with pulp.  No orange juice.              CLEAR LIQUIDS INCLUDE: Water Jello Ice Popsicles Tea (sugar ok, no milk/cream) Powdered fruit flavored drinks Coffee (sugar ok, no milk/cream) Gatorade Juice: apple, white grape, white cranberry  Lemonade Clear bullion, consomm, broth Carbonated beverages (any kind) Strained chicken noodle soup Hard Candy   ___________________________________________________________________________________________________  THE DAY OF YOUR PROCEDURE            DATE: 08/28/09   DAY:  Tuesday  1.   Use Fleet Enema two hours prior to coming for procedure.  2.   Use Fleet Eneam one hour prior to coming for procedure.  You may drink clear liquids until 8:30 am (2 hours before exam)       MEDICATION INSTRUCTIONS  Unless otherwise instructed, you should take regular prescription medications with a small sip of water as early as possible the morning of your procedure.        OTHER INSTRUCTIONS  You will need a responsible adult at least 71 years of age to accompany you and drive you home.     This person must remain in the waiting room during your procedure.  Wear loose fitting clothing that is easily removed.  Leave jewelry and other valuables at home.  However, you may wish to bring a book to read or an iPod/MP3 player to listen to music as you wait for your procedure to start.  Remove all body piercing jewelry and leave at home.  Total time from sign-in until discharge is approximately 2-3 hours.  You should go home directly after your procedure and rest.  You can resume normal activities the day after your procedure.  The day of your procedure you should not:   Drive   Make legal decisions   Operate machinery   Drink alcohol   Return to work  You will receive specific instructions about eating, activities and medications before you leave.   The above instructions have been reviewed and explained to me by   Hortense Ramal CMA Duncan Dull)  August 01, 2009 12:14 PM     I fully understand and can verbalize these instructions _____________________________ Date 08/01/09

## 2010-05-21 NOTE — Procedures (Signed)
Summary: Flexible Sigmoidoscopy  Patient: Albert Moore Note: All result statuses are Final unless otherwise noted.  Tests: (1) Flexible Sigmoidoscopy (FLX)  FLX Flexible Sigmoidoscopy                             DONE     [facility]     [address]     [place], [place]  [address]           FLEXIBLE SIGMOIDOSCOPY PROCEDURE REPORT           PATIENT:  Babatunde, [name]  MR#:  [id number]     BIRTHDATE:  1941-01-23, 68 yrs. old  GENDER:  male           ENDOSCOPIST:  Barbette Hair. Arlyce Dice, MD     Referred by:           PROCEDURE DATE:  08/28/2009     PROCEDURE:  Flexible Sigmoidoscopy with polypectomy     ASA CLASS:  Class II     INDICATIONS:  change in bowel habits, history of cancer           MEDICATIONS:   Fentanyl 50 mcg IV, Versed 4 mg IV           DESCRIPTION OF PROCEDURE:   After the risks benefits and     alternatives of the procedure were thoroughly explained, informed     consent was obtained.  Digital rectal exam was performed and     revealed no abnormalities.   The [id number] endoscope     was introduced through the anus and advanced to the sigmoid colon,     without limitations.  The quality of the prep was .  The     instrument was then slowly withdrawn as the mucosa was fully     examined.     <<PROCEDUREIMAGES>>           polyps in the sigmoid colon (see image2 and image3). 2 8-43mm     sessile polyps at 30 and 25cm from anus - removed via cold     polypectomy snare and retrieved  The examination was otherwise     normal (see image1, image4, and image5). Anastamosis widely patent     Retroflexed views in the rectum revealed no abnormalities.    The     scope was then withdrawn from the patient and the procedure     terminated.           COMPLICATIONS:  None           ENDOSCOPIC IMPRESSION:     1) Polyps in the sigmoid colon     2) Otherwise normal examination.     RECOMMENDATIONS:     1) Call the office to schedule a followup office  visit for 4     weeks     2) colonscopy 6 months     3) fiber supplementation daily           REPEAT EXAM:  In 6 months for Colonoscopy.           ______________________________     Barbette Hair. Arlyce Dice, MD           CC:  Kimberlee Nearing, MD, Lupe Carney, MD           n.     Rosalie Doctor:   Barbette Hair. Renaye Janicki at 08/28/2009 11:30 AM  [place], [place], [phone]  Note: An exclamation mark (!) indicates a result that was not dispersed into the flowsheet. Document Creation Date: 08/28/2009 11:31 AM _______________________________________________________________________  (1) Order result status: Final Collection or observation date-time: 08/28/2009 11:24 Requested date-time:  Receipt date-time:  Reported date-time:  Referring Physician:   Ordering Physician: Melvia Heaps [phone]) Specimen Source:  Source: Launa Grill Order Number: [id number] Lab site:   Appended Document: Flexible Sigmoidoscopy     Procedures Next Due Date:    Colonoscopy: 02/2010

## 2010-05-21 NOTE — Letter (Signed)
[facility]   Imported By: Lester [place] 09/19/2009 09:52:43  _____________________________________________________________________  External Attachment:    Type:   Image     Comment:   External Document

## 2010-05-21 NOTE — Letter (Signed)
Summary: [facility]  [facility]   Imported By: Sherian Rein 12/31/2009 13:42:33  _____________________________________________________________________  External Attachment:    Type:   Image     Comment:   External Document

## 2010-05-21 NOTE — Letter (Signed)
[facility]   Imported By: Sherian Rein 08/27/2009 10:02:37  _____________________________________________________________________  External Attachment:    Type:   Image     Comment:   External Document

## 2010-05-21 NOTE — Op Note (Signed)
Summary: [facility]  [facility]   Imported By: Lanelle Bal 08/07/2009 11:21:24  _____________________________________________________________________  External Attachment:    Type:   Image     Comment:   External Document

## 2010-05-21 NOTE — Consult Note (Signed)
NAMEABRHAM, MASLOWSKI NO.:  [id number]      MEDICAL RECORD NO.:  [id number]          PATIENT TYPE:  INP      LOCATION:  3736                         FACILITY:  [facility]      PHYSICIAN:  Hillis Range, MD       DATE OF BIRTH:  1941/04/12      DATE OF CONSULTATION:  02/23/2009   DATE OF DISCHARGE:  02/24/2009                                    CONSULTATION      REASON FOR CONSULTATION:  Chest pain.      PRIMARY CARDIOLOGIST:  Will be new, Dr. Hillis Range.      PRIMARY CARE PHYSICIAN:  The patient does not have one.      HISTORY OF PRESENT ILLNESS:  A 70 year old Caucasian male status post   colon resection for colon cancer with this discharge from [facility] in [place] 3 days ago who woke up from a nap yesterday on his own   and felt some left-sided chest discomfort worsening in intensity,   lasting about 12 hours waxing and waning with almost go away and then   would come back very intensely.  This occurred for approximately an hour   and half before coming to the emergency room.  The patient continued to   have chest discomfort during this admission waxing and waning, although   was not as severe, so the patient during evaluation in the emergency   room was given a GI cocktail morphine and nitroglycerin with almost   complete resolution of symptoms.  The patient states the pain radiating   to his back described as sharp.  He also has chronic back pain and he   was uncertain if this was related to his back or not.  He was admitted   to rule out cardiac etiology for chest discomfort.  He also had a D-   dimer which was mildly elevated and had a subsequent CT scan which was   negative for PE.  We are asked to see the patient for evaluation and   need for stress test and in this setting.      REVIEW OF SYSTEMS:  Positive for chest pain, shortness of breath and   mild diaphoresis.  All other systems are reviewed and found to be   negative.      PAST  MEDICAL HISTORY:   1. Colon cancer status post resection of colon without chemo at       present, was just recently discharged 3 days ago from [facility]       in [place].   2. Anxiety.   3. Tachycardia in the past.  He wore a Holter monitor which was found       to be negative, also advance final spondylosis, also COPD       exacerbation.      PAST SURGICAL HISTORY:  Colon resection.      SOCIAL HISTORY:  He currently lives in [place] with his girlfriend,   recently moving from [place].  He is a 60-pack-year smoker,  negative for   EtOH or drug use.      FAMILY HISTORY:  Unknown at this time.      CURRENT MEDICATIONS:   1. Aspirin 325 mg daily.   2. Nitroglycerin 0.4 mg sublingual daily.   3. Protonix 40 mg daily.   4. Hydromorphone p.r.n.   5. Magnesium oxide p.r.n.      ALLERGIES:  No known drug allergies.      CURRENT LABORATORIES:  Sodium 132, potassium 3.8, chloride 99, CO2 of   24, BUN 8, creatinine 0.83, glucose 145, hemoglobin 14.9, hematocrit   42.8, white blood cells 13.2, platelets 417.  Troponin 0.05, 0.05, 0.01   respectively.  Urine sodium 139.  CT scan of the chest was negative for   pulmonary emboli, small right pleural effusion, atelectasis, central   lobular emphysema is noted, low density lesion in the lateral left   hepatic lobe, inpatient history of colorectal cancer.  This is   concerning for metastasis also next it showed cholelithiasis and a right   renal adenoma.  Chest x-ray revealed no acute infiltrate or edema.   Minimal left basilar atelectasis.  EKG revealing normal sinus rhythm at   rate of 70 beats per minute.      PHYSICAL EXAMINATION:  VITAL SIGNS:  Blood pressure 143/86, pulse 85,   respirations 22, temperature 98.1, O2 sat 94% on room air.   GENERAL:  He is awake, alert and oriented without complaints of chest   pain at present.   HEENT:  Head is normocephalic and atraumatic.  Eyes, PERRLA.  Mucous   membranes are pink and moist.   Tongue is midline.   NECK:  Supple without JVD or carotid bruits appreciated.   CARDIOVASCULAR:  Regular rate and rhythm without murmurs, rubs or   gallops.   LUNGS:  Essentially clear to auscultation with some bibasilar crackles   noted.   ABDOMEN:  Soft.  He does have 2+ tenderness noted post surgical   discomfort.   EXTREMITIES:  Without clubbing, cyanosis or edema.   MUSCULOSKELETAL:  There is no pain on palpation of the back or chest   wall.   NEURO:  Cranial nerves II-XII are grossly intact.      IMPRESSION:   1. Atypical chest pain.   2. Status post colon resection secondary to colon cancer.   3. History of spondylosis.   4. Chronic obstructive pulmonary disease.      PLAN:  This patient has been seen, examined by myself and Dr. Hillis Range.  It does not appear that this pain is cardiac in etiology and is   atypical for cardiac disease.  We will plan having the patient to   schedule an a pharmacologic outpatient stress Myoview to evaluate   further because he does have some risk factors.  We will follow making   further recommendations as an outpatient depending upon results of   tests.      On behalf the physicians and providers of [facility], we would   like to thanks [facility] for allowing [place] to participate   in the care of this patient.               Bettey Mare. Lyman Bishop, NP               Hillis Range, MD   Electronically Signed      KML/MEDQ  D:  02/24/2009  T:  02/25/2009  Job:  [id number]

## 2010-05-21 NOTE — Letter (Signed)
[facility]   Imported By: Sherian Rein 11/19/2009 14:26:16  _____________________________________________________________________  External Attachment:    Type:   Image     Comment:   External Document

## 2010-05-21 NOTE — Letter (Signed)
Summary: Colonoscopy Letter  [facility]  [address] [place], [place] [address]   Phone: [phone]  Fax: [phone]      January 22, 2010 MRN: [id number]   Albert Moore [address] [place], [place]  [address]   Dear Mr. DASARO,   According to your medical record, it is time for you to schedule a Colonoscopy. The American Cancer Society recommends this procedure as a method to detect early colon cancer. Patients with a family history of colon cancer, or a personal history of colon polyps or inflammatory bowel disease are at increased risk.  This letter has been generated based on the recommendations made at the time of your procedure. If you feel that in your particular situation this may no longer apply, please contact our office.  Please call our office at [phone] to schedule this appointment or to update your records at your earliest convenience.  Thank you for cooperating with [place] to provide you with the very best care possible.   Sincerely,  Barbette Hair. Arlyce Dice, M.D.  [facility] Division [phone]

## 2010-05-21 NOTE — Procedures (Signed)
Summary: Recall / [facility]  Recall / [facility]   Imported By: Lennie Odor 01/23/2010 14:51:01  _____________________________________________________________________  External Attachment:    Type:   Image     Comment:   External Document

## 2010-05-21 NOTE — Letter (Signed)
[facility]   Imported By: Lennie Odor 10/05/2009 15:31:08  _____________________________________________________________________  External Attachment:    Type:   Image     Comment:   External Document

## 2010-05-21 NOTE — Letter (Signed)
[facility]   Imported By: Sherian Rein 12/03/2009 12:32:24  _____________________________________________________________________  External Attachment:    Type:   Image     Comment:   External Document

## 2010-05-21 NOTE — Letter (Signed)
Summary:  [facility]   Imported By: Lennie Odor 01/04/2010 15:44:36  _____________________________________________________________________  External Attachment:    Type:   Image     Comment:   External Document

## 2010-05-21 NOTE — Letter (Signed)
[facility]   Imported By: Lennie Odor 10/29/2009 14:51:03  _____________________________________________________________________  External Attachment:    Type:   Image     Comment:   External Document

## 2010-07-24 LAB — COMPREHENSIVE METABOLIC PANEL
ALT: 33 U/L (ref 0–53)
AST: 23 U/L (ref 0–37)
Albumin: 3 g/dL — ABNORMAL LOW (ref 3.5–5.2)
Calcium: 8.7 mg/dL (ref 8.4–10.5)
GFR calc Af Amer: >60 mL/min (ref >60)
Sodium: 132 mEq/L — ABNORMAL LOW (ref 135–145)
Total Protein: 6 g/dL (ref 6.0–8.3)

## 2010-07-24 LAB — POCT I-STAT, CHEM 8
BUN: 8 mg/dL (ref 6–23)
Calcium, Ion: 1.06 mmol/L — ABNORMAL LOW (ref 1.12–1.32)
Creatinine, Ser: 0.7 mg/dL (ref 0.4–1.5)
TCO2: 24 mmol/L (ref 0–100)

## 2010-07-24 LAB — SODIUM, URINE, RANDOM: Sodium, Ur: 139 mEq/L

## 2010-07-24 LAB — CK TOTAL AND CKMB (NOT AT ARMC)
Relative Index: INVALID (ref 0.0–2.5)
Total CK: 36 U/L (ref 7–232)

## 2010-07-24 LAB — CARDIAC PANEL(CRET KIN+CKTOT+MB+TROPI)
CK, MB: 1.2 ng/mL (ref 0.3–4.0)
Relative Index: INVALID (ref 0.0–2.5)
Troponin I: 0.01 ng/mL (ref 0.00–0.06)

## 2010-07-24 LAB — DIFFERENTIAL
Eosinophils Absolute: 0.1 10*3/uL (ref 0.0–0.7)
Lymphs Abs: 1.2 10*3/uL (ref 0.7–4.0)
Monocytes Relative: 6 % (ref 3–12)
Neutrophils Relative %: 84 % — ABNORMAL HIGH (ref 43–77)

## 2010-07-24 LAB — CBC
HCT: 42.8 % (ref 39.0–52.0)
MCV: 95.6 fL (ref 78.0–100.0)
RBC: 4.48 MIL/uL (ref 4.22–5.81)
WBC: 13.2 10*3/uL — ABNORMAL HIGH (ref 4.0–10.5)

## 2010-07-24 LAB — POCT CARDIAC MARKERS
Myoglobin, poc: 102 ng/mL (ref 12–200)
Myoglobin, poc: 85.6 ng/mL (ref 12–200)
Troponin i, poc: <0.05 ng/mL (ref 0.00–0.09)

## 2010-07-24 LAB — TROPONIN I: Troponin I: 0.02 ng/mL (ref 0.00–0.06)

## 2010-07-24 LAB — OSMOLALITY, URINE: Osmolality, Ur: 699 mOsm/kg (ref 390–1090)

## 2011-07-24 ENCOUNTER — Telehealth: Payer: Self-pay

## 2011-07-24 ENCOUNTER — Other Ambulatory Visit: Payer: Self-pay

## 2011-07-24 DIAGNOSIS — Z85038 Personal history of other malignant neoplasm of large intestine: Secondary | ICD-10-CM

## 2011-07-24 NOTE — Telephone Encounter (Signed)
Pt moved back into area, needs port flush and wants appt w/DSM to discuss taking port out.

## 2011-07-25 ENCOUNTER — Telehealth: Payer: Self-pay | Admitting: Oncology

## 2011-07-25 NOTE — Telephone Encounter (Signed)
S/w the pt and he is aware of his flush appt on 07/28/2011@11 :00am and to pick up his may schedule at that time.

## 2011-07-28 ENCOUNTER — Ambulatory Visit (HOSPITAL_BASED_OUTPATIENT_CLINIC_OR_DEPARTMENT_OTHER): Payer: Medicare PPO

## 2011-07-28 VITALS — BP 113/71 | HR 75 | Temp 97.1°F

## 2011-07-28 DIAGNOSIS — C189 Malignant neoplasm of colon, unspecified: Secondary | ICD-10-CM

## 2011-07-28 MED ORDER — HEPARIN SOD (PORK) LOCK FLUSH 100 UNIT/ML IV SOLN
500.0000 [IU] | Freq: Once | INTRAVENOUS | Status: AC
  Administered 2011-07-28: 500 [IU] via INTRAVENOUS
  Filled 2011-07-28: qty 5

## 2011-07-28 MED ORDER — SODIUM CHLORIDE 0.9 % IJ SOLN
10.0000 mL | INTRAMUSCULAR | Status: DC | PRN
  Administered 2011-07-28: 10 mL via INTRAVENOUS
  Filled 2011-07-28: qty 10

## 2011-09-04 ENCOUNTER — Ambulatory Visit (HOSPITAL_BASED_OUTPATIENT_CLINIC_OR_DEPARTMENT_OTHER): Payer: Medicare PPO | Admitting: Oncology

## 2011-09-04 ENCOUNTER — Other Ambulatory Visit (HOSPITAL_BASED_OUTPATIENT_CLINIC_OR_DEPARTMENT_OTHER): Payer: Medicare PPO | Admitting: Lab

## 2011-09-04 ENCOUNTER — Encounter: Payer: Self-pay | Admitting: Oncology

## 2011-09-04 ENCOUNTER — Telehealth: Payer: Self-pay | Admitting: Oncology

## 2011-09-04 VITALS — BP 113/71 | HR 101 | Temp 98.0°F | Ht 73.0 in | Wt 203.0 lb

## 2011-09-04 DIAGNOSIS — C189 Malignant neoplasm of colon, unspecified: Secondary | ICD-10-CM

## 2011-09-04 DIAGNOSIS — Z85038 Personal history of other malignant neoplasm of large intestine: Secondary | ICD-10-CM

## 2011-09-04 LAB — COMPREHENSIVE METABOLIC PANEL
ALT: 8 U/L (ref 0–53)
AST: 11 U/L (ref 0–37)
BUN: 11 mg/dL (ref 6–23)
Creatinine, Ser: 0.85 mg/dL (ref 0.50–1.35)
Total Bilirubin: 0.7 mg/dL (ref 0.3–1.2)

## 2011-09-04 LAB — CBC WITH DIFFERENTIAL/PLATELET
BASO%: 0.5 % (ref 0.0–2.0)
LYMPH%: 15.1 % (ref 14.0–49.0)
MCH: 33.4 pg (ref 27.2–33.4)
MCHC: 36.1 g/dL — ABNORMAL HIGH (ref 32.0–36.0)
MCV: 92.5 fL (ref 79.3–98.0)
MONO%: 6.6 % (ref 0.0–14.0)
NEUT%: 75.7 % — ABNORMAL HIGH (ref 39.0–75.0)
Platelets: 215 10*3/uL (ref 140–400)
RBC: 4.94 10*6/uL (ref 4.20–5.82)
WBC: 7.5 10*3/uL (ref 4.0–10.3)
nRBC: 0 % (ref 0–0)

## 2011-09-04 LAB — CEA: CEA: <0.5 ng/mL (ref 0.0–5.0)

## 2011-09-04 NOTE — Progress Notes (Signed)
This office note has been dictated.  #[id number]

## 2011-09-04 NOTE — Progress Notes (Signed)
CC:   Albert Moore, M.D. Albert Moore. Albert Dice, MD,FACG  PROBLEM LIST: 1. 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  The     patient underwent low anterior resection on 02/15/2009.  He was     found to have 2/32 positive lymph nodes with extracapsular spread     and 1 mesenteric focus of tumor.  Pathologic stage was T3 N1c,     IIIB.  The patient underwent 12 cycles of FOLFOX from May 23, 2009 through October 29, 2009, then received pelvic radiation in     conjunction with continuous infusion 5 fluorouracil from 12/04/2009     through 01/15/2010.  He received a cumulative dose of 5400 cGy in     30 sessions.  He remains disease free. 2. History of tubular adenoma on sigmoidoscopy 08/28/2009. 3. 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 4. Status post excision of hamartoma involving the lateral segment of     the left lobe of the liver. 5. History of gallstones. 6. Aneurysm of ascending aorta noted 02/23/2009. 7. Diarrhea and incontinence following treatment for colon cancer. 8. 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 9. Arthritis involving both hips. 10.Spondylosis. 11.Benign prostatic hypertrophy. 12.Tinnitus. 13.Right-sided Port-A-Cath placed May 18, 2009.  MEDICATIONS: 1. Gabapentin (Neurontin) 600 mg 4 times a day. 2. Pred Forte 1% ophthalmic suspension, 1 drop in left eye daily. 3. Flomax 0.4 mg in the evening.  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  Albert Moore was last seen by [place] here on 01/31/2010.  Thereafter, he moved to [facility] and had followup there.  He tells me that he saw an oncologist whose name he thinks was Dr. Adriana Moore.  He underwent frequent CT scans every 2-3 months that apparently were negative.  He underwent a colonoscopy in the spring  of 2012, had some polyps removed.  We do not have any of these records.  The patient also was having problems with his left eye and needed a corneal transplant at [facility].  He apparently now has moved back to [place] living, I believe, with his girlfriend and renting a house.  He plans to return to [place] where he was from originally.  His main problems are numbness and paresthesias in his feet and toes secondary to oxaliplatin.  He has minimal symptoms in his hands.  His symptoms are improved with the Neurontin.  The 2nd problem relates to the flatulence, diarrhea and incontinence.  He wears Depends.  Some days he has no bowel movements, other days he will have an explosion of 5 or 6 bowel movements associated with incontinence. The patient also continues to smoke a pack of cigarettes a day despite our efforts to encourage smoking cessation.  The patient does have some mild dyspnea on exertion.  There are no symptoms to suggest recurrence of his colon cancer.  He denies any blood in his stools, feels generally well, has regained most of his lost weight.  PHYSICAL EXAM:  Albert Moore is a generally well-appearing 71 year old gentleman.  His weight today is 203 pounds.  He has gained 20 pounds in the past year and a half.  At one time, he weighed 240 pounds.  Height 6 feet 1 inch.  Body surface area 2.18 m square.  Blood pressure 113/71. Other vital signs are normal.  HEENT:  There is no scleral icterus. Mouth and pharynx are benign.  He has dentures.  No adenopathy palpable. Heart and lungs:  Normal.  There is a right-sided Port-A-Cath that was last flushed with heparin about a month ago.  Abdomen:  Benign with no organomegaly or masses palpable.  Extremities:  No peripheral edema or clubbing.  Neurologic:  Exam is grossly normal.  He does have a Port-A- Cath on the right side.  LABORATORY DATA:  Today, white count is 7.5, ANC 5.6, hemoglobin 16.5, hematocrit 45.7, platelets 215,000.   Chemistries and CEA today are pending.  Chemistries from 04/12/2010 were entirely normal.  CEA was less than 0.5.  IMAGING STUDIES: 1. CT angiogram of the chest with IV contrast on 02/23/2009 showed no     evidence for acute pulmonary embolism.  There was a low-density     lesion in the lateral left hepatic lobe as well as cholelithiasis     and a right adrenal adenoma. 2. MRI of the abdomen with and without IV contrast from 05/22/2009     showed a 7 mm lesion within the lateral segment of the left liver     lobe demonstrating delayed circumferential mild enhancement.  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 3. CT scan of chest, abdomen and pelvis with IV contrast on 11/13/2009     showed stable changes of COPD with no CT evidence for pulmonary     metastatic disease.  There was a fusiform aneurysmal dilatation of     the ascending thoracic aorta measuring 4.4 x 4.4 cm.  There was     felt to be a slight increase since the prior study.  Continued     surveillance was recommended.  There were no CT findings to suggest     metastatic disease in the liver.  Tiny low attenuation liver     lesions were unchanged.  There was surgical changes involving the     rectum without any CT evidence for recurrent tumor or adenopathy.     There was stable right adrenal gland adenoma.  PROCEDURES: 1. Flexible sigmoidoscopy with polypectomy carried out by Dr. Melvia Heaps on 08/28/2009.  A tubular adenoma was removed from the     sigmoid colon. 2. Colonoscopy was carried out in the spring of 2012 in the [place]     area.  Apparently, several polyps were removed.  The patient will     provide [place] with these reports.  IMPRESSION AND PLAN:  Clinically, Albert Moore is doing well with no evidence for recurrent colon cancer now approximately 2-1/2 years from the time of diagnosis back in October 2010.  I have asked Albert Moore to try  to obtain the reports of the imaging studies that were carried out, specially CT scans as well as the colonoscopy report and the pathology report associated with colonoscopy.  He may need additional scans depending on these results.  He also needs follow up for his aneurysm of the ascending aorta.  He may need appropriate referral.  We will continue to maintain the Port-A-Cath with heparin flushes.  Next heparin flush will be due in about 1 month.  We will plan to refer the patient to Dr. Melvia Heaps regarding his diarrhea and incontinence. He may need some extra bulk and possibly some agents to  slow motility. The patient may need another colonoscopy in the next couple of years if indeed he has adenomatous polyps.  The patient again was urged to pursue smoking cessation.  He does not seem overly interested at this time.  We will plan to see him again somewhere around July 26, at which time we will check CBC, chemistries and CEA.    ______________________________ Samul Dada, M.D. DSM/MEDQ  D:  09/04/2011  T:  09/04/2011  Job:  [id number]

## 2011-09-04 NOTE — Telephone Encounter (Signed)
Gave pt appt calendar for June 2013 flush and GI appt with Dr. Arlyce Dice. Then in July 2013 lab and MD

## 2011-10-06 ENCOUNTER — Ambulatory Visit (HOSPITAL_BASED_OUTPATIENT_CLINIC_OR_DEPARTMENT_OTHER): Payer: Medicare PPO

## 2011-10-06 VITALS — BP 111/70 | HR 84 | Temp 98.6°F

## 2011-10-06 DIAGNOSIS — C189 Malignant neoplasm of colon, unspecified: Secondary | ICD-10-CM

## 2011-10-06 DIAGNOSIS — Z452 Encounter for adjustment and management of vascular access device: Secondary | ICD-10-CM

## 2011-10-06 MED ORDER — HEPARIN SOD (PORK) LOCK FLUSH 100 UNIT/ML IV SOLN
500.0000 [IU] | Freq: Once | INTRAVENOUS | Status: AC
  Administered 2011-10-06: 500 [IU] via INTRAVENOUS
  Filled 2011-10-06: qty 5

## 2011-10-06 MED ORDER — SODIUM CHLORIDE 0.9 % IJ SOLN
10.0000 mL | INTRAMUSCULAR | Status: DC | PRN
  Administered 2011-10-06: 10 mL via INTRAVENOUS
  Filled 2011-10-06: qty 10

## 2011-10-10 ENCOUNTER — Ambulatory Visit (INDEPENDENT_AMBULATORY_CARE_PROVIDER_SITE_OTHER): Payer: Medicare PPO | Admitting: Gastroenterology

## 2011-10-10 ENCOUNTER — Encounter: Payer: Self-pay | Admitting: Gastroenterology

## 2011-10-10 VITALS — BP 120/70 | HR 68 | Ht 73.0 in | Wt 199.1 lb

## 2011-10-10 DIAGNOSIS — Z860100 Personal history of colon polyps, unspecified: Secondary | ICD-10-CM | POA: Insufficient documentation

## 2011-10-10 DIAGNOSIS — R159 Full incontinence of feces: Secondary | ICD-10-CM | POA: Insufficient documentation

## 2011-10-10 NOTE — Patient Instructions (Addendum)
You have been given a separate informational sheet regarding your tobacco use, the importance of quitting and local resources to help you quit. We have placed you on the list to have the Solesta injection Once a schedule for Dr Arlyce Dice has come out for that we will call you to schedule Follow up visit in one month

## 2011-10-10 NOTE — Assessment & Plan Note (Signed)
Recommend colonoscopy in 5 years

## 2011-10-10 NOTE — Progress Notes (Signed)
History of Present Illness:  Albert Moore has returned for evaluation of incontinence. In  2010 he underwent resection for an upper rectal carcinoma. Sigmoidoscopy 2011 demonstrated sigmoid polyps. Anastomosis was patent without stricturing.  Approximately 6 months ago he apparently underwent colonoscopy where 2 more polyps were removed.  He frequently passes very small amounts of gas which he claims he cannot control. He is incontinent of stool. Stools tend to be poorly formed. He may have loose stools followed by watery stools, especially after meals. He sometimes is unaware of passing stool but thinks it may occur when he is passing gas. He wears depends.    Past Medical History  Diagnosis Date  . Arthritis   . Asthma   . Colorectal cancer   . Depression    Past Surgical History  Procedure Date  . Appendectomy   . Colon resection   . Fetal surgery for congenital hernia    family history includes Breast cancer in his mother and Pancreatic cancer in his father. Current Outpatient Prescriptions  Medication Sig Dispense Refill  . gabapentin (NEURONTIN) 600 MG tablet Take 600 mg by mouth QID.      [name] prednisoLONE acetate (PRED FORTE) 1 % ophthalmic suspension Place 1 drop into the left eye daily.      . Tamsulosin HCl (FLOMAX) 0.4 MG CAPS Take 0.4 mg by mouth daily after supper.       Allergies as of 10/10/2011 - Review Complete 10/10/2011  Allergen Reaction Noted  . Shellfish allergy Hives 09/04/2011    reports that he has been smoking Cigarettes.  He has never used smokeless tobacco. He reports that he drinks alcohol. He reports that he does not use illicit drugs.     Review of Systems: Pertinent positive and negative review of systems were noted in the above HPI section. All other review of systems were otherwise negative.  Vital signs were reviewed in today's medical record Physical Exam: General: Well developed , well nourished, no acute distress Head: Normocephalic and  atraumatic Eyes:  sclerae anicteric, EOMI Ears: Normal auditory acuity Mouth: No deformity or lesions Neck: Supple, no masses or thyromegaly Lungs: Clear throughout to auscultation Heart: Regular rate and rhythm; no murmurs, rubs or bruits Abdomen: Soft, non tender and non distended. No masses, hepatosplenomegaly or hernias noted. Normal Bowel sounds Rectal: There is a large amount of stool in his diaper. Rectal tone is decreased. There no masses. Stools Hemoccult negative. Musculoskeletal: Symmetrical with no gross deformities  Skin: No lesions on visible extremities Pulses:  Normal pulses noted Extremities: No clubbing, cyanosis, edema or deformities noted Neurological: Alert oriented x 4, grossly nonfocal Cervical Nodes:  No significant cervical adenopathy Inguinal Nodes: No significant inguinal adenopathy Psychological:  Alert and cooperative. Normal mood and affect

## 2011-10-10 NOTE — Assessment & Plan Note (Signed)
This is probably secondary  to a combination of loose stools with poor sphincter control.  Recommendations #1 Imodium 2 tabs every morning #2 fiber supplementation daily #3 Solesta injection of the anus

## 2011-11-14 ENCOUNTER — Ambulatory Visit (HOSPITAL_COMMUNITY)
Admission: RE | Admit: 2011-11-14 | Discharge: 2011-11-14 | Disposition: A | Discharge status: Home / Self Care | Payer: Medicare PPO | Source: Ambulatory Visit | Attending: Oncology | Admitting: Oncology

## 2011-11-14 ENCOUNTER — Ambulatory Visit (HOSPITAL_BASED_OUTPATIENT_CLINIC_OR_DEPARTMENT_OTHER): Payer: Medicare PPO | Admitting: Oncology

## 2011-11-14 ENCOUNTER — Telehealth: Payer: Self-pay | Admitting: *Deleted

## 2011-11-14 ENCOUNTER — Other Ambulatory Visit: Payer: Self-pay

## 2011-11-14 ENCOUNTER — Other Ambulatory Visit (HOSPITAL_BASED_OUTPATIENT_CLINIC_OR_DEPARTMENT_OTHER): Payer: Medicare PPO

## 2011-11-14 ENCOUNTER — Encounter: Payer: Self-pay | Admitting: Oncology

## 2011-11-14 VITALS — BP 125/71 | HR 73 | Temp 97.3°F | Ht 73.0 in | Wt 200.3 lb

## 2011-11-14 DIAGNOSIS — Z85038 Personal history of other malignant neoplasm of large intestine: Secondary | ICD-10-CM

## 2011-11-14 DIAGNOSIS — C20 Malignant neoplasm of rectum: Secondary | ICD-10-CM

## 2011-11-14 DIAGNOSIS — G589 Mononeuropathy, unspecified: Secondary | ICD-10-CM

## 2011-11-14 DIAGNOSIS — I719 Aortic aneurysm of unspecified site, without rupture: Secondary | ICD-10-CM

## 2011-11-14 DIAGNOSIS — R42 Dizziness and giddiness: Secondary | ICD-10-CM

## 2011-11-14 DIAGNOSIS — C189 Malignant neoplasm of colon, unspecified: Secondary | ICD-10-CM | POA: Insufficient documentation

## 2011-11-14 LAB — CBC WITH DIFFERENTIAL/PLATELET
Basophils Absolute: 0 10*3/uL (ref 0.0–0.1)
EOS%: 4.4 % (ref 0.0–7.0)
Eosinophils Absolute: 0.3 10*3/uL (ref 0.0–0.5)
HCT: 45.6 % (ref 38.4–49.9)
HGB: 15.7 g/dL (ref 13.0–17.1)
MCH: 33.2 pg (ref 27.2–33.4)
MCV: 96.3 fL (ref 79.3–98.0)
NEUT#: 4.3 10*3/uL (ref 1.5–6.5)
NEUT%: 72.7 % (ref 39.0–75.0)
lymph#: 0.9 10*3/uL (ref 0.9–3.3)

## 2011-11-14 LAB — COMPREHENSIVE METABOLIC PANEL
AST: 12 U/L (ref 0–37)
Albumin: 3.9 g/dL (ref 3.5–5.2)
BUN: 11 mg/dL (ref 6–23)
Calcium: 9.2 mg/dL (ref 8.4–10.5)
Chloride: 106 mEq/L (ref 96–112)
Creatinine, Ser: 0.9 mg/dL (ref 0.50–1.35)
Glucose, Bld: 103 mg/dL — ABNORMAL HIGH (ref 70–99)
Potassium: 4.3 mEq/L (ref 3.5–5.3)

## 2011-11-14 LAB — LACTATE DEHYDROGENASE: LDH: 107 U/L (ref 94–250)

## 2011-11-14 LAB — CEA: CEA: 1 ng/mL (ref 0.0–5.0)

## 2011-11-14 MED ORDER — GABAPENTIN 300 MG PO CAPS: 900.0000 mg | ORAL_CAPSULE | Freq: Four times a day (QID) | ORAL | Status: DC

## 2011-11-14 NOTE — Progress Notes (Signed)
This office note has been dictated.  #[id number]

## 2011-11-14 NOTE — Telephone Encounter (Signed)
Release for medical records faxed to Dr Jeanie Cooks

## 2011-11-14 NOTE — Telephone Encounter (Signed)
Mailed out calendar and gave patient appointment

## 2011-11-14 NOTE — Progress Notes (Signed)
CC:   [name], M.D. Barbette Hair. Arlyce Dice, MD,FACG  PROBLEM LIST:  1. 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 The  patient underwent low anterior resection on 02/15/2009. He was  found to have 2/32 positive lymph nodes with extracapsular spread  and 1 mesenteric focus of tumor. Pathologic stage was T3 N1c,  IIIB. The patient underwent 12 cycles of FOLFOX from May 23, 2009 through October 29, 2009, then received pelvic radiation in  conjunction with continuous infusion 5 fluorouracil from 12/04/2009  through 01/15/2010. He received a cumulative dose of 5400 cGy in  30 sessions. He remains disease free.  2. History of tubular adenoma on sigmoidoscopy 08/28/2009.  3. 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  4. Status post excision of hamartoma involving the lateral segment of  the left lobe of the liver.  5. History of gallstones.  6. Aneurysm of ascending aorta noted 02/23/2009.  7. Diarrhea and incontinence following treatment for colon cancer.  8. 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  9. Arthritis involving both hips.  10.Spondylosis.  11.Benign prostatic hypertrophy.  12.Tinnitus.  13.Right-sided Port-A-Cath placed May 18, 2009.    MEDICATIONS:  1. Gabapentin (Neurontin) 600 mg 4 times a day.  2. Pred Forte 1% ophthalmic suspension, 1 drop in left eye daily.  3. Flomax 0.4 mg in the evening.   SMOKING HISTORY:  The patient has smoked up to 2-1/2 packs of cigarettes a day for 50-60 years.  Currently he is smoking 1 pack of cigarettes a day.  We have on numerous occasions tried to encourage the patient about smoking cessation.  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  Mr. Dom was last seen by [place] on 09/04/2011.  His main complaints today are of  some dizziness related to head movements.  This does not sound particularly orthostatic.  The patient also notes some visual abnormalities and has had some drainage from 1 or both of his ears.  We suspect that this may be due to an inner ear problem, perhaps external otitis.  Symptoms started after a rafting trip.  If his symptoms continue, then he may need to see an ear, nose, and throat specialist.  The patient also complains of having sore testicles.  Left side is mostly affected.  We suggested he may want to see his primary physician or a urologist.  The patient complains of continued neuropathy due to the oxaliplatin. He has been on 600 mg 4 times a day.  He feels that he is developing some tolerance for the medicine, as his neuropathy is not helped as much by the medicines.  On the basis of that, we going to increase the Neurontin up to 900 mg 4 times a day.  The patient seems to be tolerating the medicine quite well.  He saw Dr. Melvia Heaps for his bowel problems.  He says that he is 100% improved on the recommendations, which include Imodium 2 tabs every morning and fiber supplement daily.  The patient apparently will be getting some injections, Solesta, at some point to strengthen his anal sphincter.  The patient had told us on his visit 2 months ago that he had scans done and had been seen by an oncologist in [place] by the name of Dr. Adriana Simas.  He apparently has had  frequent scans and now tells me that he has the disks.  He does not have the reports.  He was supposed to have had the records sent to [place], but we never received these records.  Once again, I have asked the patient to try to obtain the records for [place], specifically copies of the scan reports.  I have also asked him to bring in the CT scans themselves, which apparently he has in his possession. Of concern is the aneurysm of the ascending aorta that was noted back in early November 2010.  Aside from these  issues, the patient seems to be doing well.  He is not wearing Depends everyday due to the improvement of his bowel function. There are no symptoms to suggest recurrent colon cancer.  He denies any blood in his stools.  His last colonoscopy, he thinks may have been in January of this year done in [place].  On his last visit here on 09/04/2011, he had told me that he had a colonoscopy in the spring of 2012 and had some polyps removed  Thus, it is a little difficult to obtain what appears to be a consistent or accurate history from this patient.  PHYSICAL EXAMINATION:  General:  The patient looks well and is in dynamic spirits as usual.  Vital Signs:  Weight is 200.3 pounds, height 6 feet 1 inch, body surface area 2.16 sq m.  Blood pressure 125/71. Other vital signs are normal.  HEENT:  There is no scleral icterus. Mouth and pharynx are benign.  Lymph:  No peripheral adenopathy palpable.  Heart and Lungs:  Normal.  Port-A-Cath was present on the right side.  This was flushed with heparin today.  Abdomen:  Benign with no organomegaly or masses palpable.  The was a little tenderness. Extremities:  No peripheral edema or clubbing.  Neurologic Exam: Grossly normal.  GU:  I offered to examine the patient's testes. But he deferred.  LABORATORY DATA:  Today white count 5.9, ANC 4.3, hemoglobin 15.7, hematocrit 45.6, platelets 194,000.  Chemistries today are pending. Chemistries from 09/04/2011 were normal except for a sodium of 133.  BUN was 11, creatinine 0.85.  Albumin 3.9.  CEA was less than 0.5.  IMAGING STUDIES:  1. CT angiogram of the chest with IV contrast on 02/23/2009 showed no  evidence for acute pulmonary embolism. There was a low-density  lesion in the lateral left hepatic lobe as well as cholelithiasis  and a right adrenal adenoma.  2. MRI of the abdomen with and without IV contrast from 05/22/2009  showed a 7 mm lesion within the lateral segment of the left liver  lobe  demonstrating delayed circumferential mild enhancement. 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  3. CT scan of chest, abdomen and pelvis with IV contrast on 11/13/2009  showed stable changes of COPD with no CT evidence for pulmonary  metastatic disease. There was a fusiform aneurysmal dilatation of  the ascending thoracic aorta measuring 4.4 x 4.4 cm. There was  felt to be a slight increase since the prior study. Continued  surveillance was recommended. There were no CT findings to suggest  metastatic disease in the liver. Tiny low attenuation liver  lesions were unchanged. There was surgical changes involving the  rectum without any CT evidence for recurrent tumor or adenopathy.  There was stable right adrenal gland adenoma.  4. Chest x-ray 2 view carried out 11/14/2011 showed no acute findings.  There were changes suggestive of COPD.   PROCEDURES:  1. Flexible sigmoidoscopy with polypectomy carried out by Dr. Melvia Heaps on 08/28/2009. A tubular adenoma was removed from the  sigmoid colon.  2. Colonoscopy was carried out in the spring of 2012 in the [place]  area. Apparently, several polyps were removed. The patient will  provide [place] with these reports.    IMPRESSION AND PLAN:  Clinically Mr. Arwood continues to do well with no evidence for recurrent colon cancer, now approaching 3 years from the time of diagnosis in October 2010.  Once again, I have asked the patient to provide some information about his various followup studies that were carried out in [place], specifically CT scans, their reports, and records regarding his last colonoscopy.  The patient said he will get [place] those reports.  He is going to have a chest x-ray today.  I have asked him to return in 2 months, at which time we will check CBC, chemistries, CEA.  He will need Port-A-Cath flushed at that time.  We are increasing the patient's Neurontin to  900 mg 4 times a day.  The patient may need at least a CT scan of his chest if we are unable to get records from him.  I am particularly concerned about the aneurysm of his ascending aorta.  On the CT scan of the chest from 11/13/2009, it was noted that there was a fusiform aneurysmal dilatation of the ascending aorta measuring 4.4 x 4.4 cm at the level of the right main pulmonary artery.  This was slightly larger than it had been on the prior CT scan from 2010, when it measured 3.9 x 3.9 cm.    ______________________________ Samul Dada, M.D. DSM/MEDQ  D:  11/14/2011  T:  11/14/2011  Job:  [id number]

## 2011-11-19 ENCOUNTER — Ambulatory Visit (INDEPENDENT_AMBULATORY_CARE_PROVIDER_SITE_OTHER): Payer: Medicare PPO | Admitting: Gastroenterology

## 2011-11-19 ENCOUNTER — Encounter: Payer: Self-pay | Admitting: Gastroenterology

## 2011-11-19 VITALS — BP 132/64 | HR 80 | Ht 73.0 in | Wt 199.0 lb

## 2011-11-19 DIAGNOSIS — R159 Full incontinence of feces: Secondary | ICD-10-CM

## 2011-11-19 DIAGNOSIS — Z860100 Personal history of colon polyps, unspecified: Secondary | ICD-10-CM

## 2011-11-19 MED ORDER — TAMSULOSIN HCL 0.4 MG PO CAPS: 0.4000 mg | ORAL_CAPSULE | Freq: Every day | ORAL | Status: AC

## 2011-11-19 NOTE — Progress Notes (Signed)
History of Present Illness:  Patient has returned for followup of incontinence. On a regimen of fiber and Imodium incontinence has entirely subsided. Main complaint is excess flatus when he eats pasta or milk. Symptoms  are controlled with dietary discretion.    Review of Systems: Pertinent positive and negative review of systems were noted in the above HPI section. All other review of systems were otherwise negative.    Current Medications, Allergies, Past Medical History, Past Surgical History, Family History and Social History were reviewed in [employer] electronic medical record  Vital signs were reviewed in today's medical record. Physical Exam: General: Well developed , well nourished, no acute distress

## 2011-11-19 NOTE — Patient Instructions (Addendum)
Follow up as needed

## 2011-11-19 NOTE — Assessment & Plan Note (Signed)
Resolved on a regimen of fiber and Imodium.

## 2011-11-19 NOTE — Assessment & Plan Note (Signed)
Plan followup colonoscopy 2018 

## 2011-12-24 ENCOUNTER — Encounter: Payer: Self-pay | Admitting: Gastroenterology

## 2012-01-08 ENCOUNTER — Telehealth: Payer: Self-pay

## 2012-01-08 NOTE — Telephone Encounter (Signed)
Attempted 9/18 and 9/19 to contact pt for Dr Jeanie Cooks number. Found number on internet and contact Dr [place] office for medical records. The office said records were sent on Aug 6th. I asked that they be resent because I could not find them in e-chart.

## 2012-01-13 ENCOUNTER — Telehealth: Payer: Self-pay | Admitting: Oncology

## 2012-01-13 ENCOUNTER — Other Ambulatory Visit: Payer: Medicare PPO | Admitting: Lab

## 2012-01-13 ENCOUNTER — Other Ambulatory Visit: Payer: Self-pay

## 2012-01-13 ENCOUNTER — Ambulatory Visit: Payer: Medicare PPO | Admitting: Oncology

## 2012-01-13 NOTE — Telephone Encounter (Signed)
phone fast busy???? mail out new appt as pt missed 9.24   aom

## 2012-01-14 ENCOUNTER — Encounter: Payer: Self-pay | Admitting: Oncology

## 2012-01-14 NOTE — Progress Notes (Signed)
Patient's records from [facility] were reviewed. Telephone number is [phone].  The patient apparently underwent CT scans of the abdomen and pelvis without IV contrast on the following dates:  06/06/2010 10/03/2010 01/08/2011 04/29/2011    The patient apparently underwent a colonoscopy during the early part of 2012. We may want to try to obtain the exact date and that report. According to a note from Dr. Melvia Heaps, the patient's next colonoscopy should be in 2018. I will need to confirm that with Dr. Arlyce Dice.  The patient apparently was noted to have cholelithiasis on CT scans.   The patient's diagnosis goes back to October 2010. Since his last CT scan was on 04/29/2011, the patient probably should have another CT scan before the end of the year.

## 2012-01-15 ENCOUNTER — Other Ambulatory Visit: Payer: Medicare PPO | Admitting: Lab

## 2012-01-26 ENCOUNTER — Telehealth: Payer: Self-pay | Admitting: Oncology

## 2012-01-26 NOTE — Telephone Encounter (Signed)
called to cx 10/15 appt and will c/b to r/s as he is in [place]. w/ his sister whom has a new dx of ca     aom

## 2012-02-03 ENCOUNTER — Ambulatory Visit: Payer: Medicare PPO | Admitting: Oncology

## 2012-02-03 ENCOUNTER — Other Ambulatory Visit: Payer: Medicare PPO | Admitting: Lab

## 2012-10-11 ENCOUNTER — Telehealth: Payer: Self-pay | Admitting: Oncology

## 2013-07-07 ENCOUNTER — Telehealth: Payer: Self-pay

## 2013-07-07 ENCOUNTER — Other Ambulatory Visit: Payer: Self-pay

## 2013-07-07 NOTE — Telephone Encounter (Signed)
Pt lvm that he recently moved back to [place] and needs his port flushed. Last seen at [facility] July of 2013. POF to scheduler for lab, MD visit, and port flush.

## 2013-07-11 ENCOUNTER — Ambulatory Visit (HOSPITAL_BASED_OUTPATIENT_CLINIC_OR_DEPARTMENT_OTHER): Payer: Medicare PPO

## 2013-07-11 ENCOUNTER — Telehealth: Payer: Self-pay | Admitting: Internal Medicine

## 2013-07-11 VITALS — BP 142/88 | HR 81 | Temp 97.3°F

## 2013-07-11 DIAGNOSIS — C20 Malignant neoplasm of rectum: Secondary | ICD-10-CM

## 2013-07-11 DIAGNOSIS — Z452 Encounter for adjustment and management of vascular access device: Secondary | ICD-10-CM

## 2013-07-11 DIAGNOSIS — Z95828 Presence of other vascular implants and grafts: Secondary | ICD-10-CM

## 2013-07-11 MED ORDER — HEPARIN SOD (PORK) LOCK FLUSH 100 UNIT/ML IV SOLN
500.0000 [IU] | Freq: Once | INTRAVENOUS | Status: AC
  Administered 2013-07-11: 500 [IU] via INTRAVENOUS
  Filled 2013-07-11: qty 5

## 2013-07-11 MED ORDER — SODIUM CHLORIDE 0.9 % IJ SOLN
10.0000 mL | INTRAMUSCULAR | Status: DC | PRN
  Administered 2013-07-11: 10 mL via INTRAVENOUS
  Filled 2013-07-11: qty 10

## 2013-07-11 NOTE — Telephone Encounter (Signed)
, °

## 2013-07-11 NOTE — Patient Instructions (Signed)

## 2013-08-02 ENCOUNTER — Telehealth: Payer: Self-pay | Admitting: Internal Medicine

## 2013-08-02 NOTE — Telephone Encounter (Signed)
CHANGED TIME OF 4/21 APPT PER DESK NURSE/ CALLED BUT WAS NOT ABLE TO REACH HIM AND INFORMED THAT NUMBER USED WAS WRONG AND PT NO LONGER HAD NUMBER AS LISTED IN EPIC. SCHEDULE MAILED

## 2013-08-09 ENCOUNTER — Other Ambulatory Visit (HOSPITAL_BASED_OUTPATIENT_CLINIC_OR_DEPARTMENT_OTHER): Payer: Medicare PPO

## 2013-08-09 ENCOUNTER — Other Ambulatory Visit: Payer: Self-pay | Admitting: Internal Medicine

## 2013-08-09 ENCOUNTER — Telehealth: Payer: Self-pay | Admitting: Internal Medicine

## 2013-08-09 ENCOUNTER — Ambulatory Visit (HOSPITAL_BASED_OUTPATIENT_CLINIC_OR_DEPARTMENT_OTHER): Payer: Medicare PPO | Admitting: Internal Medicine

## 2013-08-09 ENCOUNTER — Encounter: Payer: Self-pay | Admitting: Internal Medicine

## 2013-08-09 VITALS — BP 133/79 | HR 74 | Temp 97.5°F | Resp 18 | Ht 73.0 in | Wt 204.9 lb

## 2013-08-09 DIAGNOSIS — I714 Abdominal aortic aneurysm, without rupture, unspecified: Secondary | ICD-10-CM

## 2013-08-09 DIAGNOSIS — C20 Malignant neoplasm of rectum: Secondary | ICD-10-CM

## 2013-08-09 DIAGNOSIS — I719 Aortic aneurysm of unspecified site, without rupture: Secondary | ICD-10-CM

## 2013-08-09 DIAGNOSIS — G620 Drug-induced polyneuropathy: Secondary | ICD-10-CM

## 2013-08-09 LAB — COMPREHENSIVE METABOLIC PANEL (CC13)
ALK PHOS: 77 U/L (ref 40–150)
ALT: 9 U/L (ref 0–55)
AST: 12 U/L (ref 5–34)
Albumin: 3.4 g/dL — ABNORMAL LOW (ref 3.5–5.0)
Anion Gap: 10 mEq/L (ref 3–11)
BILIRUBIN TOTAL: 0.71 mg/dL (ref 0.20–1.20)
BUN: 12.7 mg/dL (ref 7.0–26.0)
CALCIUM: 9.5 mg/dL (ref 8.4–10.4)
CHLORIDE: 105 meq/L (ref 98–109)
CO2: 23 mEq/L (ref 22–29)
CREATININE: 0.8 mg/dL (ref 0.7–1.3)
Glucose: 125 mg/dl (ref 70–140)
Potassium: 4.2 mEq/L (ref 3.5–5.1)
Sodium: 138 mEq/L (ref 136–145)
Total Protein: 6.5 g/dL (ref 6.4–8.3)

## 2013-08-09 LAB — CBC WITH DIFFERENTIAL/PLATELET
BASO%: 0.8 % (ref 0.0–2.0)
BASOS ABS: 0.1 10*3/uL (ref 0.0–0.1)
EOS%: 2.1 % (ref 0.0–7.0)
Eosinophils Absolute: 0.1 10*3/uL (ref 0.0–0.5)
HCT: 45.4 % (ref 38.4–49.9)
HEMOGLOBIN: 15.5 g/dL (ref 13.0–17.1)
LYMPH%: 15.1 % (ref 14.0–49.0)
MCH: 32.4 pg (ref 27.2–33.4)
MCHC: 34.2 g/dL (ref 32.0–36.0)
MCV: 94.8 fL (ref 79.3–98.0)
MONO#: 0.4 10*3/uL (ref 0.1–0.9)
MONO%: 5.6 % (ref 0.0–14.0)
NEUT#: 5.3 10*3/uL (ref 1.5–6.5)
NEUT%: 76.4 % — ABNORMAL HIGH (ref 39.0–75.0)
PLATELETS: 232 10*3/uL (ref 140–400)
RBC: 4.79 10*6/uL (ref 4.20–5.82)
RDW: 13.2 % (ref 11.0–14.6)
WBC: 6.9 10*3/uL (ref 4.0–10.3)
lymph#: 1 10*3/uL (ref 0.9–3.3)

## 2013-08-09 MED ORDER — GABAPENTIN 600 MG PO TABS: 600.0000 mg | ORAL_TABLET | Freq: Three times a day (TID) | ORAL | Status: DC

## 2013-08-09 NOTE — Telephone Encounter (Signed)
Gave pt appt for lab and MD on June, pt will see dr Kelby Fam PA  this Thursday 2015

## 2013-08-09 NOTE — Progress Notes (Signed)
[facility] OFFICE PROGRESS NOTE  No PCP Per Patient No address on file  DIAGNOSIS: Adenocarcinoma of rectum  Chief Complaint  Patient presents with  . PERSONAL HISTORY MALIG NEOPLASM LARGE INTESTINE    CURRENT TREATMENT: Observation.   INTERVAL HISTORY: Albert Moore 73 y.o. 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 Albert Moore was last seen by [place] on 07216/2013. He is now [age] years old from his last colonoscopy.  He had his last colonoscopy by Dr. Deatra Ina about 3 years.  He has since relocated from [facility] recently and was being followed locally by medical oncology (Dr. Elberta Leatherwood).  His last visit was six months ago.  He reports having his port-a-cath flush monthly.  He reports continued numbness and tingling in his finger tips bilaterally that are mild;  He reports that his feet is totally numb with tingling, needles and at times feels likes its on fire.  He was on gabapentin but his prescription ran out about 3 months ago.  Dr. Elberta Leatherwood would not re-write a prescription.    He was gabapentin 600 mg every 6 hours with some improvement.  Since his last visit, about one year ago, he woke up with extreme pain in his left hip which he feels needs to be replaced.  He is seeing an orthopedic surgeon, Dr. Marry Guan.  He is taking meloxicam7.5 mg bid.  He does not have a follow up with Dr. Marry Guan.  He states that he has to see the family doctor for preoperative screening. He reports that his sister passed away from lung cancer recently.  He was her primary caretaker.   MEDICAL HISTORY: Past Medical History  Diagnosis Date  . Arthritis   . Asthma   . Colorectal cancer   . Depression     INTERIM HISTORY: has CHANGE IN BOWELS; PERSONAL HISTORY MALIG NEOPLASM LARGE INTESTINE; SHELLFISH ALLERGY; Stool incontinence; Personal history of colonic polyps; and Adenocarcinoma of rectum on his problem list.     ALLERGIES:  is allergic to shellfish allergy.  MEDICATIONS: has a current medication list which includes the following prescription(s): meloxicam, prednisolone acetate, gabapentin, and tamsulosin.  SURGICAL HISTORY:  Past Surgical History  Procedure Laterality Date  . Appendectomy    . Colon resection    . Fetal surgery for congenital hernia     PROBLEM LIST:  1. Adenocarcinoma of the upper and mid rectum with near-total obstruction as well as perforation and abscess formation. The patient underwent low anterior resection on 02/15/2009. He was found to have 2/32 positive lymph nodes with extracapsular spread and 1 mesenteric focus of tumor. Pathologic stage was T3 N1c, IIIB. The patient underwent 12 cycles of FOLFOX from May 23, 2009 through October 29, 2009, then received pelvic radiation in conjunction with continuous infusion 5 fluorouracil from 12/04/2009 through 01/15/2010. He received a cumulative dose of 5400 cGy in 30 sessions. He remains disease free.  2. History of tubular adenoma on sigmoidoscopy 08/28/2009.  3. 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  4. Status post excision of hamartoma involving the lateral segment of  the left lobe of the liver.  5. History of gallstones.  6. Aneurysm of ascending aorta noted 02/23/2009.  7. Diarrhea and incontinence following treatment for colon cancer.  8. 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  9. Arthritis involving both hips.  10.Spondylosis.  11.Benign prostatic hypertrophy.  12.Tinnitus.  13.Right-sided Port-A-Cath placed May 18, 2009.   REVIEW OF SYSTEMS:   Constitutional: Denies fevers, chills or abnormal weight loss Eyes: Denies blurriness of vision Ears, nose, mouth, throat, and face: Denies mucositis or sore throat Respiratory: Denies cough, dyspnea or wheezes Cardiovascular: Denies palpitation, chest discomfort or lower extremity swelling Gastrointestinal:  Denies nausea,  heartburn or change in bowel habits Skin: Denies abnormal skin rashes Lymphatics: Denies new lymphadenopathy or easy bruising Neurological:Denies numbness, tingling or new weaknesses Behavioral/Psych: Mood is stable, no new changes  All other systems were reviewed with the patient and are negative.  PHYSICAL EXAMINATION: ECOG PERFORMANCE STATUS: 1 - Symptomatic but completely ambulatory  Blood pressure 133/79, pulse 74, temperature 97.5 F (36.4 C), temperature source Oral, resp. rate 18, height 6\' 1"  (1.854 m), weight 204 lb 14.4 oz (92.942 kg), SpO2 93.00%.  GENERAL:alert, no distress and comfortable; well developed and well nourished.  SKIN: skin color, texture, turgor are normal, no rashes or significant lesions; R portacath EYES: normal, Conjunctiva are pink and non-injected, sclera clear OROPHARYNX:no exudate, no erythema and lips, buccal mucosa, and tongue normal  NECK: supple, thyroid normal size, non-tender, without nodularity LYMPH:  no palpable lymphadenopathy in the cervical, axillary or supraclavicular LUNGS: clear to auscultation with normal breathing effort, no wheezes or rhonchi HEART: regular rate & rhythm and no murmurs and no lower extremity edema ABDOMEN:abdomen soft, non-tender and normal bowel sounds Musculoskeletal:no cyanosis of digits and no clubbing  NEURO: alert & oriented x 3 with fluent speech, no focal motor/sensory deficits  Labs:  Lab Results  Component Value Date   WBC 6.9 08/09/2013   HGB 15.5 08/09/2013   HCT 45.4 08/09/2013   MCV 94.8 08/09/2013   PLT 232 08/09/2013   NEUTROABS 5.3 08/09/2013      Chemistry      Component Value Date/Time   NA 138 08/09/2013 1204   NA 139 11/14/2011 0917   K 4.2 08/09/2013 1204   K 4.3 11/14/2011 0917   CL 106 11/14/2011 0917   CO2 23 08/09/2013 1204   CO2 27 11/14/2011 0917   BUN 12.7 08/09/2013 1204   BUN 11 11/14/2011 0917   CREATININE 0.8 08/09/2013 1204   CREATININE 0.90 11/14/2011 0917      Component Value  Date/Time   CALCIUM 9.5 08/09/2013 1204   CALCIUM 9.2 11/14/2011 0917   ALKPHOS 77 08/09/2013 1204   ALKPHOS 62 11/14/2011 0917   AST 12 08/09/2013 1204   AST 12 11/14/2011 0917   ALT 9 08/09/2013 1204   ALT 9 11/14/2011 0917   BILITOT 0.71 08/09/2013 1204   BILITOT 0.5 11/14/2011 0917       Basic Metabolic Panel:  Recent Labs Lab 08/09/13 1204  NA 138  K 4.2  CO2 23  GLUCOSE 125  BUN 12.7  CREATININE 0.8  CALCIUM 9.5   GFR Estimated Creatinine Clearance: 92.9 ml/min (by C-G formula based on Cr of 0.8). Liver Function Tests:  Recent Labs Lab 08/09/13 1204  AST 12  ALT 9  ALKPHOS 77  BILITOT 0.71  PROT 6.5  ALBUMIN 3.4*   No results found for this basename: LIPASE, AMYLASE,  in the last 168 hours No results found for this basename: AMMONIA,  in the last 168 hours Coagulation profile No results found for this basename: INR, PROTIME,  in the last 168 hours  CBC:  Recent Labs Lab 08/09/13 1203  WBC 6.9  NEUTROABS 5.3  HGB 15.5  HCT 45.4  MCV 94.8  PLT 232  Anemia work up No results found for this basename: VITAMINB12, FOLATE, FERRITIN, TIBC, IRON, RETICCTPCT,  in the last 72 hours  Studies:  No results found.   RADIOGRAPHIC STUDIES: No results found.  ASSESSMENT: Albert Moore 73 y.o. male with a history of Adenocarcinoma of rectum   PLAN:  1. Colon Cancer.  --Clinically Albert Moore continues to do well with no evidence for recurrent colon cancer, now approaching 4.5 years from the time of diagnosis in October 2010.  Patient was followed in [place] and has now recently relocated.  We asked the patient to have his records from his oncologist forwarded to our office, specifically his CT scans. --He requested portacath removal.  We will discuss upon his next visit.   2. Neuropathy secondary to chemotherapy. --We provided him a prescription of gabapentin 600 mg q 8 hours.     3. Follow up. --I have asked him to return in  2 months, at which time  we will check CBC, chemistries, CEA. He will need Port-A-Cath every 8 weeks.  He reports that he had it flushed in [place] 8 weeks ago.    4. Aneurysm of his ascending aorta.   -- The patient may need at least a CT scan of his chest if we are unable to get records from him. There is still concern about the aneurysm of his ascending aorta. On the CT scan of the chest from 11/13/2009, it was noted that there was a fusiform aneurysmal dilatation of the ascending  aorta measuring 4.4 x 4.4 cm at the level of the right main pulmonary artery. This was slightly larger than it had been on the prior CT scan from 2010, when it measured 3.9 x 3.9 cm.  We will order a CT scan of chest prior to his next visit.   5. Left hip arthritis. --He reports following with ortho for consideration of left hip replacement.   All questions were answered. The patient knows to call the clinic with any problems, questions or concerns. We can certainly see the patient much sooner if necessary.  I spent 15 minutes counseling the patient face to face. The total time spent in the appointment was 25 minutes.    Concha Norway, MD 08/09/2013 1:11 PM

## 2013-08-10 LAB — CEA: CEA: 0.6 ng/mL (ref 0.0–5.0)

## 2013-08-12 ENCOUNTER — Ambulatory Visit (INDEPENDENT_AMBULATORY_CARE_PROVIDER_SITE_OTHER): Payer: Medicare PPO | Admitting: Physician Assistant

## 2013-08-12 ENCOUNTER — Encounter: Payer: Self-pay | Admitting: Physician Assistant

## 2013-08-12 VITALS — BP 110/64 | HR 68 | Ht 72.0 in | Wt 206.1 lb

## 2013-08-12 DIAGNOSIS — R142 Eructation: Secondary | ICD-10-CM

## 2013-08-12 DIAGNOSIS — Z85048 Personal history of other malignant neoplasm of rectum, rectosigmoid junction, and anus: Secondary | ICD-10-CM

## 2013-08-12 DIAGNOSIS — R141 Gas pain: Secondary | ICD-10-CM

## 2013-08-12 DIAGNOSIS — R143 Flatulence: Secondary | ICD-10-CM

## 2013-08-12 NOTE — Progress Notes (Signed)
Subjective:    Patient ID: Albert Moore, male    DOB: Mar 27, 1941, 73 y.o.   MRN: [id number]  HPI  Albert Moore is a pleasant 73 year old white male with history of a stage III B upper mid rectal adenocarcinoma diagnosed in 2010. He underwent chemotherapy and radiation. He is known to Dr. Deatra Ina. He was last seen in 2011 when he underwent a flexible sigmoidoscopy in may of 2011. He was found to have 2 sigmoid colon polyps 8-10 mm in size these were removed. Otherwise negative exam. He was to followup in 6 months. Biopsy of the polyps showed tubular adenomas. Patient apparently had been living in [place] intermittently, and has not come back for followup colonoscopy. He had recently been evaluated by oncology/Dr. Chism and is referred back at this time. He says that he has had some long-term problems with intermittent fecal soilage and alternating diarrhea and constipation. He had been put on a regimen of fiber supplement and Imodium per Dr. Deatra Ina and says for the most part this  works very well but now he has episodes of what sounds like constipation with frequent urge for bowel movement and passage of only small amounts of stool. He says he may have 4-5 separate bowel movements and several hours but only passes small amounts will of stool at that time. Eventually evacuates a lot of stool and and feels better. He says sometimes he has some fecal soilage with these episodes. He also has a lot of ongoing issues with gas. Other than that he says he has been feeling well he does have chronic hip pain and is considering a hip replacement. His appetite has been fine his weight has been stable he is not noted any melena or hematochezia.    Review of Systems  Constitutional: Negative.   HENT: Negative.   Eyes: Negative.   Respiratory: Negative.   Gastrointestinal: Positive for constipation.  Endocrine: Negative.   Genitourinary: Negative.   Musculoskeletal: Positive for arthralgias and gait problem.  Skin:  Negative.   Allergic/Immunologic: Negative.   Neurological: Negative.   Hematological: Negative.   Psychiatric/Behavioral: Negative.    Outpatient Prescriptions Prior to Visit  Medication Sig Dispense Refill  . gabapentin (NEURONTIN) 600 MG tablet Take 1 tablet (600 mg total) by mouth 3 (three) times daily.  90 tablet  1  . meloxicam (MOBIC) 7.5 MG tablet Take by mouth 2 (two) times daily.       . prednisoLONE acetate (PRED FORTE) 1 % ophthalmic suspension Place 1 drop into the left eye daily.      . Tamsulosin HCl (FLOMAX) 0.4 MG CAPS Take 1 capsule (0.4 mg total) by mouth daily after supper.  30 capsule  5   No facility-administered medications prior to visit.   Allergies  Allergen Reactions  . Shellfish Allergy Hives       Patient Active Problem List   Diagnosis Date Noted  . Aneurysm of aorta 08/09/2013  . Adenocarcinoma of rectum 11/14/2011  . Stool incontinence 10/10/2011  . Personal history of colonic polyps 10/10/2011  . SHELLFISH ALLERGY 08/01/2009   History  Substance Use Topics  . Smoking status: Former Smoker    Types: Cigarettes    Quit date: 05/11/2013  . Smokeless tobacco: Never Used  . Alcohol Use: Yes     Comment: rarely   family history includes Breast cancer in his mother; Pancreatic cancer in his father.  Objective:   Physical Exam  well-developed older white male in no acute distress, pleasant blood  pressure 110/64 pulse 68 height 6 foot weight 206. HEENT; nontraumatic normocephalic EOMI PERRLA sclera anicteric, Supple ;no JVD, Cardiovascular regular rate and rhythm with S1-S2 no murmur or gallop, Pulmonary; clear bilaterally, Abdomen; is soft nontender nondistended bowel sounds are active no palpable mass or hepatosplenomegaly, Rectal; exam not done, Extremities; no clubbing cyanosis or edema skin warm and dry, Psych ;mood and affect appropriate        Assessment & Plan:  #74  73 year old male with history of stage III B. adenocarcinoma of the  upper rectum diagnosed 2010, overdue for followup colonoscopy.  #2 history of adenomatous colon polyps last sigmoidoscopy May 2011  # 3 history of thoracic aortic aneurysm  #4 altered bowel habits since original diagnosis of rectal cancer now leaning more towards constipation   Plan ; schedule patient for colonoscopy with Dr. Deatra Ina .procedure discussed in detail with patient and he is agreeable to proceed  Also discussed his constipation issues which are intermittent. He will try to doses of MiraLax about once a week to see if purging his bowel will stop these episodes.

## 2013-08-12 NOTE — Patient Instructions (Signed)
We will call you once the July schedule is in so you can choose a morning appointment for the colonoscopy.  We have gvien you samples of Restora, take 1 capsule daily. Samples provided. You have been scheduled for a colonoscopy with propofol. Please follow written instructions given to you at your visit today.  Please pick up your prep kit at the pharmacy within the next 1-3 days. If you use inhalers (even only as needed), please bring them with you on the day of your procedure. Your physician has requested that you go to www.startemmi.com and enter the access code given to you at your visit today. This web site gives a general overview about your procedure. However, you should still follow specific instructions given to you by our office regarding your preparation for the procedure.

## 2013-08-15 NOTE — Progress Notes (Signed)
Reviewed and agree with management. Jenie Parish D. Larkin Alfred, M.D., FACG  

## 2013-08-25 ENCOUNTER — Encounter: Payer: Self-pay | Admitting: Gastroenterology

## 2013-09-02 ENCOUNTER — Telehealth: Payer: Self-pay | Admitting: Internal Medicine

## 2013-09-02 ENCOUNTER — Other Ambulatory Visit: Payer: Self-pay

## 2013-09-02 NOTE — Telephone Encounter (Signed)
S/w the pt and he is aware of his lab and ct scan on 10/06/2013

## 2013-09-08 ENCOUNTER — Encounter: Payer: Medicare PPO | Admitting: Gastroenterology

## 2013-09-13 ENCOUNTER — Ambulatory Visit: Payer: Self-pay | Admitting: Cardiology

## 2013-10-06 ENCOUNTER — Ambulatory Visit (HOSPITAL_COMMUNITY)
Admission: RE | Admit: 2013-10-06 | Discharge: 2013-10-06 | Disposition: A | Discharge status: Home / Self Care | Payer: Medicare PPO | Source: Ambulatory Visit | Attending: Internal Medicine | Admitting: Internal Medicine

## 2013-10-06 ENCOUNTER — Other Ambulatory Visit: Payer: Self-pay | Admitting: Internal Medicine

## 2013-10-06 ENCOUNTER — Ambulatory Visit (HOSPITAL_BASED_OUTPATIENT_CLINIC_OR_DEPARTMENT_OTHER): Payer: Medicare PPO

## 2013-10-06 ENCOUNTER — Other Ambulatory Visit (HOSPITAL_BASED_OUTPATIENT_CLINIC_OR_DEPARTMENT_OTHER): Payer: Medicare PPO

## 2013-10-06 DIAGNOSIS — Z452 Encounter for adjustment and management of vascular access device: Secondary | ICD-10-CM

## 2013-10-06 DIAGNOSIS — C20 Malignant neoplasm of rectum: Secondary | ICD-10-CM

## 2013-10-06 DIAGNOSIS — I719 Aortic aneurysm of unspecified site, without rupture: Secondary | ICD-10-CM

## 2013-10-06 DIAGNOSIS — Z95828 Presence of other vascular implants and grafts: Secondary | ICD-10-CM

## 2013-10-06 LAB — COMPREHENSIVE METABOLIC PANEL (CC13)
ALT: 9 U/L (ref 0–55)
AST: 10 U/L (ref 5–34)
Albumin: 3.5 g/dL (ref 3.5–5.0)
Alkaline Phosphatase: 62 U/L (ref 40–150)
Anion Gap: 7 mEq/L (ref 3–11)
BILIRUBIN TOTAL: 0.84 mg/dL (ref 0.20–1.20)
BUN: 19.2 mg/dL (ref 7.0–26.0)
CHLORIDE: 108 meq/L (ref 98–109)
CO2: 25 mEq/L (ref 22–29)
CREATININE: 0.8 mg/dL (ref 0.7–1.3)
Calcium: 9.2 mg/dL (ref 8.4–10.4)
Glucose: 120 mg/dl (ref 70–140)
Potassium: 4.1 mEq/L (ref 3.5–5.1)
Sodium: 140 mEq/L (ref 136–145)
Total Protein: 6.3 g/dL — ABNORMAL LOW (ref 6.4–8.3)

## 2013-10-06 LAB — CBC WITH DIFFERENTIAL/PLATELET
BASO%: 0.7 % (ref 0.0–2.0)
BASOS ABS: 0 10*3/uL (ref 0.0–0.1)
EOS ABS: 0.4 10*3/uL (ref 0.0–0.5)
EOS%: 6.1 % (ref 0.0–7.0)
HEMATOCRIT: 43.7 % (ref 38.4–49.9)
HGB: 15.1 g/dL (ref 13.0–17.1)
LYMPH#: 0.9 10*3/uL (ref 0.9–3.3)
LYMPH%: 14.9 % (ref 14.0–49.0)
MCH: 32.3 pg (ref 27.2–33.4)
MCHC: 34.6 g/dL (ref 32.0–36.0)
MCV: 93.6 fL (ref 79.3–98.0)
MONO#: 0.4 10*3/uL (ref 0.1–0.9)
MONO%: 6.4 % (ref 0.0–14.0)
NEUT%: 71.9 % (ref 39.0–75.0)
NEUTROS ABS: 4.2 10*3/uL (ref 1.5–6.5)
Platelets: 165 10*3/uL (ref 140–400)
RBC: 4.67 10*6/uL (ref 4.20–5.82)
RDW: 13.4 % (ref 11.0–14.6)
WBC: 5.8 10*3/uL (ref 4.0–10.3)

## 2013-10-06 LAB — CEA: CEA: 0.9 ng/mL (ref 0.0–5.0)

## 2013-10-06 MED ORDER — SODIUM CHLORIDE 0.9 % IJ SOLN
10.0000 mL | INTRAMUSCULAR | Status: DC | PRN
  Administered 2013-10-06: 10 mL via INTRAVENOUS
  Filled 2013-10-06: qty 10

## 2013-10-06 NOTE — Patient Instructions (Signed)

## 2013-10-10 ENCOUNTER — Telehealth: Payer: Self-pay | Admitting: Internal Medicine

## 2013-10-10 ENCOUNTER — Ambulatory Visit (HOSPITAL_BASED_OUTPATIENT_CLINIC_OR_DEPARTMENT_OTHER): Payer: Medicare PPO | Admitting: Internal Medicine

## 2013-10-10 ENCOUNTER — Encounter: Payer: Self-pay | Admitting: Internal Medicine

## 2013-10-10 ENCOUNTER — Other Ambulatory Visit: Payer: Medicare PPO

## 2013-10-10 VITALS — BP 131/71 | HR 81 | Temp 98.0°F | Resp 18 | Ht 72.0 in | Wt 204.5 lb

## 2013-10-10 DIAGNOSIS — Z85048 Personal history of other malignant neoplasm of rectum, rectosigmoid junction, and anus: Secondary | ICD-10-CM

## 2013-10-10 DIAGNOSIS — G620 Drug-induced polyneuropathy: Secondary | ICD-10-CM

## 2013-10-10 DIAGNOSIS — C20 Malignant neoplasm of rectum: Secondary | ICD-10-CM

## 2013-10-10 NOTE — Progress Notes (Signed)
[place] OFFICE PROGRESS NOTE  BABAOFF, Caryl Bis, MD [place] [address]  DIAGNOSIS: Adenocarcinoma of rectum - Plan: CBC with Differential, Comprehensive metabolic panel (Cmet) - [facility], Lactate dehydrogenase (LDH) - [facility], CEA  Chief Complaint  Patient presents with  . Adenocarcinoma of rectum    CURRENT TREATMENT: Observation.   INTERVAL HISTORY: Eathon Valade 73 y.o. 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 Mr. Tabet was last seen by [place] on 08/09/2013. He is now [age] years old from his last colonoscopy.  He had his last colonoscopy by Dr. Deatra Ina about 3 years.  He has since relocated from [facility] recently and was being followed locally by medical oncology (Dr. Elberta Leatherwood).  His last visit was six months ago.  He reports having his port-a-cath flush monthly.  He reports continued numbness and tingling in his finger tips bilaterally that are mild;  He reports that his feet is totally numb with tingling, needles and at times feels likes its on fire.  He was on gabapentin but his prescription ran out about 3 months ago.  Dr. Elberta Leatherwood would not re-write a prescription.    He was gabapentin 600 mg every 6 hours with some improvement.  Since his last visit, about one year ago, he woke up with extreme pain in his left hip which he feels needs to be replaced.  He is seeing an orthopedic surgeon, Dr. Marry Guan.  He is taking meloxicam 7.5 mg bid.  He does not have a follow up with Dr. Marry Guan.  He states that he has to see the family doctor for preoperative screening. He reports that his sister passed away from lung cancer recently.  He was her primary caretaker.   Today, he is scheduled L Hip Total hip replacement on the 10/24/2013.  He is also scheduled for colonoscopy with Dr. Deatra Ina.  He reports feeling well. The Left hip has been his major problem and he is excited about having it replaced.   He had his port-a-cath flushed last Thursday.   He was evaluated by cardiology with pre-surgery approval and size of his aneurysm was fine. He also saw pulmonary for pre-surgery evaluation and was approved. He still complains of peripheral neuropathy and not any significant improvement in his symptoms of "million little ants" walking on them at times.      MEDICAL HISTORY: Past Medical History  Diagnosis Date  . Arthritis   . Asthma   . Colorectal cancer   . Depression   . Neuropathy     INTERIM HISTORY: has SHELLFISH ALLERGY; Stool incontinence; Personal history of colonic polyps; Adenocarcinoma of rectum; and Aneurysm of aorta on his problem list.    ALLERGIES:  is allergic to shellfish allergy.  MEDICATIONS: has a current medication list which includes the following prescription(s): cyanocobalamin, fiber, loperamide, meloxicam, prednisolone acetate, tamsulosin, and gabapentin.  SURGICAL HISTORY:  Past Surgical History  Procedure Laterality Date  . Appendectomy    . Colon resection    . Fetal surgery for congenital hernia     PROBLEM LIST:  1. Adenocarcinoma of the upper and mid rectum with near-total obstruction as well as perforation and abscess formation. The patient underwent low anterior resection on 02/15/2009. He was found to have 2/32 positive lymph nodes with extracapsular spread and 1 mesenteric focus of tumor. Pathologic stage was T3 N1c, IIIB. The patient underwent 12 cycles of FOLFOX from May 23, 2009 through October 29, 2009, then received pelvic radiation in conjunction with continuous infusion 5 fluorouracil from 12/04/2009 through 01/15/2010. He received a cumulative dose of 5400 cGy in 30 sessions. He remains disease free.  2. History of tubular adenoma on sigmoidoscopy 08/28/2009.  3. 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  4. Status post excision of hamartoma involving the lateral segment of  the left lobe of the liver.   5. History of gallstones.  6. Aneurysm of ascending aorta noted 02/23/2009.  7. Diarrhea and incontinence following treatment for colon cancer.  8. 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  9. Arthritis involving both hips.  10.Spondylosis.  11.Benign prostatic hypertrophy.  12.Tinnitus.  13.Right-sided Port-A-Cath placed May 18, 2009.   REVIEW OF SYSTEMS:   Constitutional: Denies fevers, chills or abnormal weight loss Eyes: Denies blurriness of vision Ears, nose, mouth, throat, and face: Denies mucositis or sore throat Respiratory: Denies cough, dyspnea or wheezes Cardiovascular: Denies palpitation, chest discomfort or lower extremity swelling Gastrointestinal:  Denies nausea, heartburn or change in bowel habits Skin: Denies abnormal skin rashes Lymphatics: Denies new lymphadenopathy or easy bruising Neurological:Denies numbness, tingling or new weaknesses Behavioral/Psych: Mood is stable, no new changes  All other systems were reviewed with the patient and are negative.  PHYSICAL EXAMINATION: ECOG PERFORMANCE STATUS: 1 - Symptomatic but completely ambulatory  Blood pressure 131/71, pulse 81, temperature 98 F (36.7 C), temperature source Oral, resp. rate 18, height 6' (1.829 m), weight 204 lb 8 oz (92.761 kg).  GENERAL:alert, no distress and comfortable; well developed and well nourished.  SKIN: skin color, texture, turgor are normal, no rashes or significant lesions; R portacath EYES: normal, Conjunctiva are pink and non-injected, sclera clear OROPHARYNX:no exudate, no erythema and lips, buccal mucosa, and tongue normal  NECK: supple, thyroid normal size, non-tender, without nodularity LYMPH:  no palpable lymphadenopathy in the cervical, axillary or supraclavicular LUNGS: clear to auscultation with normal breathing effort, no wheezes or rhonchi HEART: regular rate & rhythm and no murmurs and no lower extremity edema ABDOMEN:abdomen soft, non-tender and normal bowel  sounds Musculoskeletal:no cyanosis of digits and no clubbing  NEURO: alert & oriented x 3 with fluent speech, no focal motor/sensory deficits  Labs:  Lab Results  Component Value Date   WBC 5.8 10/06/2013   HGB 15.1 10/06/2013   HCT 43.7 10/06/2013   MCV 93.6 10/06/2013   PLT 165 10/06/2013   NEUTROABS 4.2 10/06/2013      Chemistry      Component Value Date/Time   NA 140 10/06/2013 0817   NA 139 11/14/2011 0917   K 4.1 10/06/2013 0817   K 4.3 11/14/2011 0917   CL 106 11/14/2011 0917   CO2 25 10/06/2013 0817   CO2 27 11/14/2011 0917   BUN 19.2 10/06/2013 0817   BUN 11 11/14/2011 0917   CREATININE 0.8 10/06/2013 0817   CREATININE 0.90 11/14/2011 0917      Component Value Date/Time   CALCIUM 9.2 10/06/2013 0817   CALCIUM 9.2 11/14/2011 0917   ALKPHOS 62 10/06/2013 0817   ALKPHOS 62 11/14/2011 0917   AST 10 10/06/2013 0817   AST 12 11/14/2011 0917   ALT 9 10/06/2013 0817   ALT 9 11/14/2011 0917   BILITOT 0.84 10/06/2013 0817   BILITOT 0.5 11/14/2011 0917       Basic Metabolic Panel:  Recent Labs Lab 10/06/13 0817  NA 140  K 4.1  CO2 25  GLUCOSE 120  BUN  19.2  CREATININE 0.8  CALCIUM 9.2   GFR Estimated Creatinine Clearance: 90.3 ml/min (by C-G formula based on Cr of 0.8). Liver Function Tests:  Recent Labs Lab 10/06/13 0817  AST 10  ALT 9  ALKPHOS 62  BILITOT 0.84  PROT 6.3*  ALBUMIN 3.5   No results found for this basename: LIPASE, AMYLASE,  in the last 168 hours No results found for this basename: AMMONIA,  in the last 168 hours Coagulation profile No results found for this basename: INR, PROTIME,  in the last 168 hours  CBC:  Recent Labs Lab 10/06/13 0816  WBC 5.8  NEUTROABS 4.2  HGB 15.1  HCT 43.7  MCV 93.6  PLT 165    Anemia work up No results found for this basename: VITAMINB12, FOLATE, FERRITIN, TIBC, IRON, RETICCTPCT,  in the last 72 hours  Studies:  No results found.   RADIOGRAPHIC STUDIES: No results found.  ASSESSMENT: Conlan Miceli 73  y.o. male with a history of Adenocarcinoma of rectum - Plan: CBC with Differential, Comprehensive metabolic panel (Cmet) - [facility], Lactate dehydrogenase (LDH) - [facility], CEA   PLAN:  1. Colon Cancer.  --Clinically Mr. Tinnon continues to do well with no evidence for recurrent colon cancer, now approaching 5 years from the time of diagnosis in October 2010. He requested portacath removal.  We will discuss upon his next visit following his L THR.  CEA on 10/06/2013 was 0.9.   2. L THR planned on 10/24/2013. --He is scheduled and has had presurgical clearance by cardiology and pulmonary, per patient.    3. Neuropathy secondary to chemotherapy. --We provided him a prescription of gabapentin 600 mg q 8 hours last visit but stopped taking them due ineffective relief in his symptoms as noted above.  We will consider a trial of lyrica once daily next visit.   4. Aneurysm of ascending aorta. --Stable.  He is following with cardiology.  On the CT scan of the chest from 11/13/2009, it was noted that there was a fusiform aneurysmal dilatation of the ascending aorta measuring 4.4 x 4.4 cm at the level of the right main pulmonary artery. This was slightly larger than it had been on the prior CT scan from 2010, when it measured 3.9 x 3.9 cm.  5. Follow up. --I have asked him to return in 3  months, at which time we will check CBC, chemistries, CEA. He will need Port-A-Cath every 8 weeks.  He reports that he had it flushed in [place] 8 weeks ago.    All questions were answered. The patient knows to call the clinic with any problems, questions or concerns. We can certainly see the patient much sooner if necessary.  I spent 15 minutes counseling the patient face to face. The total time spent in the appointment was 25 minutes.    CHISM, DAVID, MD 10/10/2013 1:04 PM

## 2013-10-10 NOTE — Telephone Encounter (Signed)
Gave pt appt for lab.flush, MD until October 2015

## 2013-10-12 ENCOUNTER — Ambulatory Visit: Payer: Self-pay | Admitting: General Practice

## 2013-10-12 LAB — URINALYSIS, COMPLETE
BACTERIA: NONE SEEN
BILIRUBIN, UR: NEGATIVE
Blood: NEGATIVE
Glucose,UR: NEGATIVE mg/dL (ref 0–75)
LEUKOCYTE ESTERASE: NEGATIVE
Nitrite: NEGATIVE
PROTEIN: NEGATIVE
Ph: 5 (ref 4.5–8.0)
SQUAMOUS EPITHELIAL: NONE SEEN
Specific Gravity: 1.028 (ref 1.003–1.030)

## 2013-10-12 LAB — BASIC METABOLIC PANEL
Anion Gap: 6 — ABNORMAL LOW (ref 7–16)
BUN: 13 mg/dL (ref 7–18)
CHLORIDE: 104 mmol/L (ref 98–107)
Calcium, Total: 9 mg/dL (ref 8.5–10.1)
Co2: 27 mmol/L (ref 21–32)
Creatinine: 0.7 mg/dL (ref 0.60–1.30)
EGFR (African American): >60
EGFR (Non-African Amer.): >60
Glucose: 120 mg/dL — ABNORMAL HIGH (ref 65–99)
Osmolality: 275 (ref 275–301)
POTASSIUM: 3.7 mmol/L (ref 3.5–5.1)
Sodium: 137 mmol/L (ref 136–145)

## 2013-10-12 LAB — CBC
HCT: 44.9 % (ref 40.0–52.0)
HGB: 15.3 g/dL (ref 13.0–18.0)
MCH: 32.9 pg (ref 26.0–34.0)
MCHC: 34.1 g/dL (ref 32.0–36.0)
MCV: 97 fL (ref 80–100)
PLATELETS: 182 10*3/uL (ref 150–440)
RBC: 4.66 10*6/uL (ref 4.40–5.90)
RDW: 13.8 % (ref 11.5–14.5)
WBC: 6.8 10*3/uL (ref 3.8–10.6)

## 2013-10-12 LAB — PROTIME-INR
INR: 1.1
Prothrombin Time: 14.3 secs (ref 11.5–14.7)

## 2013-10-12 LAB — APTT: ACTIVATED PTT: 30.9 s (ref 23.6–35.9)

## 2013-10-12 LAB — MRSA PCR SCREENING

## 2013-10-12 LAB — SEDIMENTATION RATE: ERYTHROCYTE SED RATE: 3 mm/h (ref 0–20)

## 2013-10-14 LAB — URINE CULTURE

## 2013-10-24 ENCOUNTER — Inpatient Hospital Stay: Payer: Self-pay | Admitting: General Practice

## 2013-10-25 LAB — BASIC METABOLIC PANEL
Anion Gap: 7 (ref 7–16)
BUN: 9 mg/dL (ref 7–18)
CALCIUM: 7.7 mg/dL — AB (ref 8.5–10.1)
CREATININE: 1.01 mg/dL (ref 0.60–1.30)
Chloride: 101 mmol/L (ref 98–107)
Co2: 27 mmol/L (ref 21–32)
Glucose: 103 mg/dL — ABNORMAL HIGH (ref 65–99)
OSMOLALITY: 269 (ref 275–301)
Potassium: 3.8 mmol/L (ref 3.5–5.1)
Sodium: 135 mmol/L — ABNORMAL LOW (ref 136–145)

## 2013-10-25 LAB — PLATELET COUNT: Platelet: 148 10*3/uL — ABNORMAL LOW (ref 150–440)

## 2013-10-25 LAB — HEMOGLOBIN: HGB: 12.1 g/dL — ABNORMAL LOW (ref 13.0–18.0)

## 2013-10-26 LAB — BASIC METABOLIC PANEL
Anion Gap: 8 (ref 7–16)
BUN: 7 mg/dL (ref 7–18)
CHLORIDE: 101 mmol/L (ref 98–107)
CO2: 26 mmol/L (ref 21–32)
CREATININE: 0.85 mg/dL (ref 0.60–1.30)
Calcium, Total: 8.4 mg/dL — ABNORMAL LOW (ref 8.5–10.1)
EGFR (African American): >60
Glucose: 95 mg/dL (ref 65–99)
OSMOLALITY: 268 (ref 275–301)
Potassium: 3.8 mmol/L (ref 3.5–5.1)
Sodium: 135 mmol/L — ABNORMAL LOW (ref 136–145)

## 2013-10-26 LAB — PLATELET COUNT: Platelet: 168 10*3/uL (ref 150–440)

## 2013-10-26 LAB — PATHOLOGY REPORT

## 2013-10-26 LAB — HEMOGLOBIN: HGB: 11.9 g/dL — AB (ref 13.0–18.0)

## 2013-11-08 ENCOUNTER — Encounter: Payer: Medicare PPO | Admitting: Gastroenterology

## 2013-11-25 ENCOUNTER — Ambulatory Visit (HOSPITAL_BASED_OUTPATIENT_CLINIC_OR_DEPARTMENT_OTHER): Payer: Medicare PPO

## 2013-11-25 VITALS — BP 123/73 | HR 80

## 2013-11-25 DIAGNOSIS — Z95828 Presence of other vascular implants and grafts: Secondary | ICD-10-CM

## 2013-11-25 DIAGNOSIS — Z452 Encounter for adjustment and management of vascular access device: Secondary | ICD-10-CM

## 2013-11-25 DIAGNOSIS — Z85048 Personal history of other malignant neoplasm of rectum, rectosigmoid junction, and anus: Secondary | ICD-10-CM

## 2013-11-25 MED ORDER — HEPARIN SOD (PORK) LOCK FLUSH 100 UNIT/ML IV SOLN
500.0000 [IU] | Freq: Once | INTRAVENOUS | Status: AC
  Administered 2013-11-25: 500 [IU] via INTRAVENOUS
  Filled 2013-11-25: qty 5

## 2013-11-25 MED ORDER — SODIUM CHLORIDE 0.9 % IJ SOLN
10.0000 mL | INTRAMUSCULAR | Status: DC | PRN
  Administered 2013-11-25: 10 mL via INTRAVENOUS
  Filled 2013-11-25: qty 10

## 2014-01-09 ENCOUNTER — Encounter: Payer: Self-pay | Admitting: Gastroenterology

## 2014-01-09 ENCOUNTER — Ambulatory Visit (AMBULATORY_SURGERY_CENTER): Payer: Medicare PPO | Admitting: Gastroenterology

## 2014-01-09 VITALS — BP 114/64 | HR 70 | Temp 96.5°F | Resp 15 | Ht 72.0 in | Wt 206.0 lb

## 2014-01-09 DIAGNOSIS — D122 Benign neoplasm of ascending colon: Secondary | ICD-10-CM

## 2014-01-09 DIAGNOSIS — D125 Benign neoplasm of sigmoid colon: Secondary | ICD-10-CM

## 2014-01-09 DIAGNOSIS — D126 Benign neoplasm of colon, unspecified: Secondary | ICD-10-CM

## 2014-01-09 DIAGNOSIS — Z85048 Personal history of other malignant neoplasm of rectum, rectosigmoid junction, and anus: Secondary | ICD-10-CM

## 2014-01-09 MED ORDER — SODIUM CHLORIDE 0.9 % IV SOLN: 500.0000 mL | INTRAVENOUS | Status: DC

## 2014-01-09 NOTE — Progress Notes (Signed)
Called to room to assist during endoscopic procedure.  Patient ID and intended procedure confirmed with present staff. Received instructions for my participation in the procedure from the performing physician.  

## 2014-01-09 NOTE — Op Note (Signed)
[facility]  [address] [place], [address]   COLONOSCOPY PROCEDURE REPORT  PATIENT: Albert Moore, Albert Moore  MR#: [id number] BIRTHDATE: July 12, 1940 , 52  yrs. old GENDER: male ENDOSCOPIST: Inda Castle, MD REFERRED BY: PROCEDURE DATE:  01/09/2014 PROCEDURE:   Colonoscopy with snare First Screening Colonoscopy - Avg.  risk and is 50 yrs.  old or older - No.  Prior Negative Screening - Now for repeat screening. N/A  History of Adenoma - Now for follow-up colonoscopy & has been > or = to 3 yrs.  Yes hx of adenoma.  Has been 3 or more years since last colonoscopy.  Polyps Removed Today? Yes. ASA CLASS:   Class II INDICATIONS:high risk personal history of rectal cancer. MEDICATIONS: Per Anesthesia and Propofol (Diprivan) 140 mg IV  DESCRIPTION OF PROCEDURE:   After the risks benefits and alternatives of the procedure were thoroughly explained, informed consent was obtained.  revealed no abnormalities of the rectum. The [id number]  endoscope was introduced through the anus and advanced to the cecum, which was identified by both the appendix and ileocecal valve. No adverse events experienced.   The quality of the prep was Suprep good  The instrument was then slowly withdrawn as the colon was fully examined.      COLON FINDINGS: A sessile polyp measuring 3 mm in size was found in the ascending colon.  A polypectomy was performed with a cold snare.  The resection was complete, the polyp tissue was completely retrieved and sent to histology.   A sessile polyp measuring 6 mm in size was found in the sigmoid colon.  A polypectomy was performed with a cold snare.  A polypectomy was performed with a cold snare.  The resection was complete, the polyp tissue was completely retrieved and sent to histology.   Melanosis coli was found.   Melanosis coli was found.   The colon mucosa was otherwise normal.  Retroflexion was not performed due to a narrow rectal vault. The  time to cecum=1 minutes 55 seconds.  Withdrawal time=8 minutes 26 seconds.  The scope was withdrawn and the procedure completed. COMPLICATIONS: There were no complications.  ENDOSCOPIC IMPRESSION: 1.   Sessile polyp measuring 3 mm in size was found in the ascending colon; polypectomy was performed with a cold snare 2.   Sessile polyp measuring 6 mm in size was found in the sigmoid colon; polypectomy was performed with a cold snare; polypectomy was performed with a cold snare 3.   Melanosis coli was found 4.   Melanosis coli was found 5.   The colon mucosa was otherwise normal  RECOMMENDATIONS: If the polyp(s) removed today are proven to be adenomatous (pre-cancerous) polyps, you will need a repeat colonoscopy in 5 years.  Otherwise you should continue to follow colorectal cancer screening guidelines for "routine risk" patients with colonoscopy in 10 years.  You will receive a letter within 1-2 weeks with the results of your biopsy as well as final recommendations.  Please call my office if you have not received a letter after 3 weeks.  eSigned:  Inda Castle, MD 01/09/2014 9:41 AM   cc: Dimas Aguas, MD   PATIENT NAME:  Albert Moore, Albert Moore MR#: [id number]

## 2014-01-09 NOTE — Progress Notes (Signed)
[id number] - Awaiting Dr. Deatra Ina. 0300 - Awaiting Dr. Deatra Ina

## 2014-01-09 NOTE — Patient Instructions (Signed)
YOU HAD AN ENDOSCOPIC PROCEDURE TODAY AT THE [facility]: Refer to the procedure report that was given to you for any specific questions about what was found during the examination.  If the procedure report does not answer your questions, please call your gastroenterologist to clarify.  If you requested that your care partner not be given the details of your procedure findings, then the procedure report has been included in a sealed envelope for you to review at your convenience later.  YOU SHOULD EXPECT: Some feelings of bloating in the abdomen. Passage of more gas than usual.  Walking can help get rid of the air that was put into your GI tract during the procedure and reduce the bloating. If you had a lower endoscopy (such as a colonoscopy or flexible sigmoidoscopy) you may notice spotting of blood in your stool or on the toilet paper. If you underwent a bowel prep for your procedure, then you may not have a normal bowel movement for a few days.  DIET: Your first meal following the procedure should be a light meal and then it is ok to progress to your normal diet.  A half-sandwich or bowl of soup is an example of a good first meal.  Heavy or fried foods are harder to digest and may make you feel nauseous or bloated.  Likewise meals heavy in dairy and vegetables can cause extra gas to form and this can also increase the bloating.  Drink plenty of fluids but you should avoid alcoholic beverages for 24 hours.  ACTIVITY: Your care partner should take you home directly after the procedure.  You should plan to take it easy, moving slowly for the rest of the day.  You can resume normal activity the day after the procedure however you should NOT DRIVE or use heavy machinery for 24 hours (because of the sedation medicines used during the test).    SYMPTOMS TO REPORT IMMEDIATELY: A gastroenterologist can be reached at any hour.  During normal business hours, 8:30 AM to 5:00 PM Monday through Friday,  call [phone].  After hours and on weekends, please call the GI answering service at [phone] who will take a message and have the physician on call contact you.   Following lower endoscopy (colonoscopy or flexible sigmoidoscopy):  Excessive amounts of blood in the stool  Significant tenderness or worsening of abdominal pains  Swelling of the abdomen that is new, acute  Fever of 100F or higher  FOLLOW UP: If any biopsies were taken you will be contacted by phone or by letter within the next 1-3 weeks.  Call your gastroenterologist if you have not heard about the biopsies in 3 weeks.  Our staff will call the home number listed on your records the next business day following your procedure to check on you and address any questions or concerns that you may have at that time regarding the information given to you following your procedure. This is a courtesy call and so if there is no answer at the home number and we have not heard from you through the emergency physician on call, we will assume that you have returned to your regular daily activities without incident.  SIGNATURES/CONFIDENTIALITY: You and/or your care partner have signed paperwork which will be entered into your electronic medical record.  These signatures attest to the fact that that the information above on your After Visit Summary has been reviewed and is understood.  Full responsibility of the confidentiality of this   discharge information lies with you and/or your care-partner.    Resume medications. Information given on polyps with discharge instructions. 

## 2014-01-09 NOTE — Progress Notes (Signed)
Procedure ends, to recovery, report given and VSS. 

## 2014-01-10 ENCOUNTER — Ambulatory Visit (HOSPITAL_BASED_OUTPATIENT_CLINIC_OR_DEPARTMENT_OTHER): Payer: Medicare PPO | Admitting: Hematology

## 2014-01-10 ENCOUNTER — Telehealth: Payer: Self-pay

## 2014-01-10 ENCOUNTER — Ambulatory Visit: Payer: Medicare PPO

## 2014-01-10 ENCOUNTER — Other Ambulatory Visit (HOSPITAL_BASED_OUTPATIENT_CLINIC_OR_DEPARTMENT_OTHER): Payer: Medicare PPO

## 2014-01-10 VITALS — BP 138/77 | HR 74 | Temp 98.0°F | Resp 18 | Ht 74.0 in | Wt 200.6 lb

## 2014-01-10 DIAGNOSIS — G629 Polyneuropathy, unspecified: Secondary | ICD-10-CM

## 2014-01-10 DIAGNOSIS — C20 Malignant neoplasm of rectum: Secondary | ICD-10-CM

## 2014-01-10 DIAGNOSIS — I712 Thoracic aortic aneurysm, without rupture, unspecified: Secondary | ICD-10-CM

## 2014-01-10 DIAGNOSIS — G622 Polyneuropathy due to other toxic agents: Secondary | ICD-10-CM

## 2014-01-10 DIAGNOSIS — Z95828 Presence of other vascular implants and grafts: Secondary | ICD-10-CM

## 2014-01-10 DIAGNOSIS — T451X5A Adverse effect of antineoplastic and immunosuppressive drugs, initial encounter: Secondary | ICD-10-CM

## 2014-01-10 DIAGNOSIS — C180 Malignant neoplasm of cecum: Secondary | ICD-10-CM

## 2014-01-10 LAB — CBC WITH DIFFERENTIAL/PLATELET
BASO%: 0.9 % (ref 0.0–2.0)
Basophils Absolute: 0.1 10*3/uL (ref 0.0–0.1)
EOS ABS: 0.2 10*3/uL (ref 0.0–0.5)
EOS%: 3.3 % (ref 0.0–7.0)
HCT: 45 % (ref 38.4–49.9)
HGB: 15 g/dL (ref 13.0–17.1)
LYMPH%: 13.5 % — AB (ref 14.0–49.0)
MCH: 31.7 pg (ref 27.2–33.4)
MCHC: 33.4 g/dL (ref 32.0–36.0)
MCV: 94.9 fL (ref 79.3–98.0)
MONO#: 0.5 10*3/uL (ref 0.1–0.9)
MONO%: 8.8 % (ref 0.0–14.0)
NEUT%: 73.5 % (ref 39.0–75.0)
NEUTROS ABS: 4.6 10*3/uL (ref 1.5–6.5)
PLATELETS: 205 10*3/uL (ref 140–400)
RBC: 4.74 10*6/uL (ref 4.20–5.82)
RDW: 13.5 % (ref 11.0–14.6)
WBC: 6.2 10*3/uL (ref 4.0–10.3)
lymph#: 0.8 10*3/uL — ABNORMAL LOW (ref 0.9–3.3)

## 2014-01-10 LAB — COMPREHENSIVE METABOLIC PANEL (CC13)
ALK PHOS: 75 U/L (ref 40–150)
ALT: 8 U/L (ref 0–55)
ANION GAP: 7 meq/L (ref 3–11)
AST: 13 U/L (ref 5–34)
Albumin: 3.6 g/dL (ref 3.5–5.0)
BILIRUBIN TOTAL: 0.55 mg/dL (ref 0.20–1.20)
BUN: 8.2 mg/dL (ref 7.0–26.0)
CO2: 24 meq/L (ref 22–29)
Calcium: 9.2 mg/dL (ref 8.4–10.4)
Chloride: 107 mEq/L (ref 98–109)
Creatinine: 0.9 mg/dL (ref 0.7–1.3)
Glucose: 121 mg/dl (ref 70–140)
Potassium: 4.2 mEq/L (ref 3.5–5.1)
Sodium: 138 mEq/L (ref 136–145)
TOTAL PROTEIN: 6.7 g/dL (ref 6.4–8.3)

## 2014-01-10 LAB — CEA: CEA: <0.5 ng/mL (ref 0.0–5.0)

## 2014-01-10 LAB — LACTATE DEHYDROGENASE (CC13): LDH: 142 U/L (ref 125–245)

## 2014-01-10 MED ORDER — HEPARIN SOD (PORK) LOCK FLUSH 100 UNIT/ML IV SOLN
500.0000 [IU] | Freq: Once | INTRAVENOUS | Status: AC
  Administered 2014-01-10: 500 [IU] via INTRAVENOUS
  Filled 2014-01-10: qty 5

## 2014-01-10 MED ORDER — PREGABALIN 75 MG PO CAPS: 75.0000 mg | ORAL_CAPSULE | Freq: Three times a day (TID) | ORAL | Status: DC

## 2014-01-10 MED ORDER — SODIUM CHLORIDE 0.9 % IJ SOLN
10.0000 mL | INTRAMUSCULAR | Status: DC | PRN
  Administered 2014-01-10: 10 mL via INTRAVENOUS
  Filled 2014-01-10: qty 10

## 2014-01-10 NOTE — Patient Instructions (Signed)

## 2014-01-10 NOTE — Telephone Encounter (Signed)
  Follow up Call-  Call back number 01/09/2014  Post procedure Call Back phone  # [phone]  Permission to leave phone message Yes     Patient questions:  Do you have a fever, pain , or abdominal swelling? No. Pain Score  0 *  Have you tolerated food without any problems? Yes.    Have you been able to return to your normal activities? Yes.    Do you have any questions about your discharge instructions: Diet   No. Medications  No. Follow up visit  No.  Do you have questions or concerns about your Care? No.  Actions: * If pain score is 4 or above: No action needed, pain <4.  No problems per the pt. maw

## 2014-01-12 ENCOUNTER — Telehealth: Payer: Self-pay | Admitting: Hematology

## 2014-01-12 ENCOUNTER — Encounter: Payer: Self-pay | Admitting: Hematology

## 2014-01-12 NOTE — Telephone Encounter (Signed)
Spk w/Sonya scheduled pt w/Dr. Johney Maine will call pt concerning this matter with time...Marland KitchenMarland Kitchenkj

## 2014-01-12 NOTE — Telephone Encounter (Signed)
Pt confirmed labs/ov per 09/24 POF, lft msg for Dr. Marlowe Aschoff [facility] to call me back to sch consultation with the MD per referral, gave pt AVS...Marland KitchenMarland KitchenKJ

## 2014-01-12 NOTE — Progress Notes (Signed)
[place] OFFICE PROGRESS NOTE 01/10/2014  BABAOFF, Caryl Bis, [place] [facility] [address]  DIAGNOSIS: RECTAL CANCER  Chief Complaint  Patient presents with  . Follow-up    CURRENT TREATMENT: Observation.   INTERVAL HISTORY:  Albert Moore 73 y.o. 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 Mr. Albert Moore was last seen by [place] on 08/09/2013. He is now [age] years old from his last colonoscopy.  He had his last colonoscopy by Dr. Deatra Ina about 3 years.  He has since relocated from [facility] recently and was being followed locally by medical oncology (Dr. Elberta Moore).  His last visit was six months ago.  He reports having his port-a-cath flush monthly.  He reports continued numbness and tingling in his finger tips bilaterally that are mild;  He reports that his feet is totally numb with tingling, needles and at times feels likes its on fire.  prescription.    He was gabapentin 600 mg every 6 hours with some improvement.  Since his last visit, about one year ago, he woke up with extreme pain in his left hip which he feels needs to be replaced.  He is seeing an orthopedic surgeon, Dr. Marry Moore.  He is taking meloxicam 7.5 mg bid.  He does not have a follow up with Dr. Marry Moore.  He states that he has to see the family doctor for preoperative screening. He reports that his sister passed away from lung cancer recently.  He was her primary caretaker.   H e had  Hip Total hip replacement on the 10/24/2013.  He reports feeling well. The Left hip has been his major problem. He had his port-a-cath flush regularly and asking me if we can have it removed.   He was evaluated by cardiology with pre-surgery approval and size of his aneurysm was fine. He also saw pulmonary for pre-surgery evaluation and was approved. He still complains of peripheral neuropathy and not any significant improvement in his symptoms of  "million little ants" walking on them at times.      He would like to try Lyrica for neuropathy on trial basis and I gave him a month prescription. He follows with a cardiologist regarding his thoracic aortic aneurysm which according to patient is stable on imaging. He had a colonoscopy yesterday in which 2 small benign looking polyps removed, pathology not available.  MEDICAL HISTORY: Past Medical History  Diagnosis Date  . Arthritis   . Asthma   . Colorectal cancer   . Depression   . Neuropathy     INTERIM HISTORY: has SHELLFISH ALLERGY; Stool incontinence; Personal history of colonic polyps; Adenocarcinoma of rectum; and Aneurysm of aorta on his problem list.    ALLERGIES:  is allergic to shellfish allergy.  MEDICATIONS: has a current medication list which includes the following prescription(s): cyanocobalamin, fiber, loperamide, meloxicam, prednisolone acetate, tamsulosin, and pregabalin.  SURGICAL HISTORY:  Past Surgical History  Procedure Laterality Date  . Appendectomy    . Colon resection    . Fetal surgery for congenital hernia    . Total hip arthroplasty Left    PROBLEM LIST:  1. Adenocarcinoma of the upper and mid rectum with near-total obstruction as well as perforation and abscess formation. The patient underwent low anterior resection on 02/15/2009. He was found to have 2/32 positive lymph nodes with extracapsular spread and 1 mesenteric focus of tumor. Pathologic stage was T3  N1c, IIIB. The patient underwent 12 cycles of FOLFOX from May 23, 2009 through October 29, 2009, then received pelvic radiation in conjunction with continuous infusion 5 fluorouracil from 12/04/2009 through 01/15/2010. He received a cumulative dose of 5400 cGy in 30 sessions. He remains disease free.  2. History of tubular adenoma on sigmoidoscopy 08/28/2009.  3. 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  4. Status post excision of hamartoma involving the  lateral segment of  the left lobe of the liver.  5. History of gallstones.  6. Aneurysm of ascending aorta noted 02/23/2009.  7. Diarrhea and incontinence following treatment for colon cancer.  8. 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  9. Arthritis involving both hips.  10.Spondylosis.  11.Benign prostatic hypertrophy.  12.Tinnitus.  13.Right-sided Port-A-Cath placed May 18, 2009.   REVIEW OF SYSTEMS:   Constitutional: Denies fevers, chills or abnormal weight loss Eyes: Denies blurriness of vision Ears, nose, mouth, throat, and face: Denies mucositis or sore throat Respiratory: Denies cough, dyspnea or wheezes Cardiovascular: Denies palpitation, chest discomfort or lower extremity swelling Gastrointestinal:  Denies nausea, heartburn or change in bowel habits Skin: Denies abnormal skin rashes Lymphatics: Denies new lymphadenopathy or easy bruising Neurological:Denies numbness, tingling or new weaknesses Behavioral/Psych: Mood is stable, no new changes  All other systems were reviewed with the patient and are negative.  PHYSICAL EXAMINATION: ECOG PERFORMANCE STATUS: 1 - Symptomatic but completely ambulatory  Blood pressure 138/77, pulse 74, temperature 98 F (36.7 C), temperature source Oral, resp. rate 18, height 6\' 2"  (1.88 m), weight 200 lb 9.6 oz (90.992 kg).  GENERAL:alert, no distress and comfortable; well developed and well nourished.  SKIN: skin color, texture, turgor are normal, no rashes or significant lesions; R portacath EYES: normal, Conjunctiva are pink and non-injected, sclera clear OROPHARYNX:no exudate, no erythema and lips, buccal mucosa, and tongue normal  NECK: supple, thyroid normal size, non-tender, without nodularity, port site ok LYMPH:  no palpable lymphadenopathy in the cervical, axillary or supraclavicular LUNGS: clear to auscultation with normal breathing effort, no wheezes or rhonchi HEART: regular rate & rhythm and no murmurs and no lower  extremity edema ABDOMEN:abdomen soft, non-tender and normal bowel sounds Musculoskeletal:no cyanosis of digits and no clubbing  NEURO: alert & oriented x 3 with fluent speech, no focal motor/sensory deficits  Labs:  Lab Results  Component Value Date   WBC 6.2 01/10/2014   HGB 15.0 01/10/2014   HCT 45.0 01/10/2014   MCV 94.9 01/10/2014   PLT 205 01/10/2014   NEUTROABS 4.6 01/10/2014      Chemistry      Component Value Date/Time   NA 138 01/10/2014 0828   NA 139 11/14/2011 0917   K 4.2 01/10/2014 0828   K 4.3 11/14/2011 0917   CL 106 11/14/2011 0917   CO2 24 01/10/2014 0828   CO2 27 11/14/2011 0917   BUN 8.2 01/10/2014 0828   BUN 11 11/14/2011 0917   CREATININE 0.9 01/10/2014 0828   CREATININE 0.90 11/14/2011 0917      Component Value Date/Time   CALCIUM 9.2 01/10/2014 0828   CALCIUM 9.2 11/14/2011 0917   ALKPHOS 75 01/10/2014 0828   ALKPHOS 62 11/14/2011 0917   AST 13 01/10/2014 0828   AST 12 11/14/2011 0917   ALT 8 01/10/2014 0828   ALT 9 11/14/2011 0917   BILITOT 0.55 01/10/2014 0828   BILITOT 0.5 11/14/2011 0917        RADIOGRAPHIC STUDIES: No recent results found.  ASSESSMENT: Hakim Minniefield 73 y.o. male with a history of Rectal cancer in remission for 5 years.   PLAN:  1. Colon Cancer.  --Clinically Mr. Weida continues to do well with no evidence for recurrent colon cancer, now approaching 5 years from the time of diagnosis in October 2010. He requested portacath removal.  We will discuss upon his next visit following his L THR.  CEA is normal. I will ask for port to be removed now as it limits his travel plans and there is a small risk of clot and infection associated with port.  2. Neuropathy secondary to chemotherapy. --We provided him a prescription of gabapentin 600 mg q 8 hours last visit but stopped taking them due ineffective relief in his symptoms as noted above.  We will consider a trial of lyrica once daily next visit. I gave him lyrica 30 day supply. If it works for  him, we can prescribe more.  4. Aneurysm of ascending aorta. --Stable.  He is following with cardiology.  On the CT scan of the chest from 11/13/2009, it was noted that there was a fusiform aneurysmal dilatation of the ascending aorta measuring 4.4 x 4.4 cm at the level of the right main pulmonary artery. This was slightly larger than it had been on the prior CT scan from 2010, when it measured 3.9 x 3.9 cm. He follows with Cardiology about it Dr Isaias Cowman at [facility] in [place], [place].  5. Follow up. --I have asked him to return in 12  months, at which time we will check CBC, chemistries, CEA.   All questions were answered. The patient knows to call the clinic with any problems, questions or concerns. We can certainly see the patient much sooner if necessary.  I spent 25 minutes counseling the patient face to face. The total time spent in the appointment was 30 minutes.    Bernadene Bell, MD Medical Hematologist/Oncologist [place] Pager: [phone] Office No: [phone]

## 2014-01-13 ENCOUNTER — Encounter: Payer: Self-pay | Admitting: Gastroenterology

## 2014-01-20 ENCOUNTER — Telehealth: Payer: Self-pay | Admitting: Hematology

## 2014-01-20 NOTE — Telephone Encounter (Signed)
returned pt call and r.s. flush for 6 weeks....pt ok and aware of new d.t

## 2014-02-21 ENCOUNTER — Ambulatory Visit (HOSPITAL_BASED_OUTPATIENT_CLINIC_OR_DEPARTMENT_OTHER): Payer: Medicare PPO

## 2014-02-21 VITALS — BP 111/70 | HR 72 | Temp 97.5°F

## 2014-02-21 DIAGNOSIS — Z452 Encounter for adjustment and management of vascular access device: Secondary | ICD-10-CM

## 2014-02-21 DIAGNOSIS — Z95828 Presence of other vascular implants and grafts: Secondary | ICD-10-CM

## 2014-02-21 DIAGNOSIS — Z85038 Personal history of other malignant neoplasm of large intestine: Secondary | ICD-10-CM

## 2014-02-21 MED ORDER — HEPARIN SOD (PORK) LOCK FLUSH 100 UNIT/ML IV SOLN
500.0000 [IU] | Freq: Once | INTRAVENOUS | Status: AC
  Administered 2014-02-21: 500 [IU] via INTRAVENOUS
  Filled 2014-02-21: qty 5

## 2014-02-21 MED ORDER — SODIUM CHLORIDE 0.9 % IJ SOLN
10.0000 mL | INTRAMUSCULAR | Status: DC | PRN
  Administered 2014-02-21: 10 mL via INTRAVENOUS
  Filled 2014-02-21: qty 10

## 2014-02-21 NOTE — Patient Instructions (Signed)

## 2014-08-12 NOTE — Discharge Summary (Signed)
PATIENT NAME:  Albert Moore, Albert Moore MR#:  [id number] DATE OF BIRTH:  Mar 26, 1941  DATE OF ADMISSION:  10/24/2013. DATE OF DISCHARGE:  10/27/2013.  ADMITTING DIAGNOSIS: Degenerative arthrosis of the left hip.   DISCHARGE DIAGNOSIS: Degenerative arthrosis of the left hip.  HISTORY: The patient is a pleasant 74 year old who has been following at [facility] for progression of left hip pain. The patient had failed conservative care management using anti-inflammatory medications, Tylenol, ice, heat, and steroid injections as well as narcotics. The patient's ACD he modified his activities of daily living but he still continued to have discomfort. The patient did have MRI of the left hip which showed AVN with degenerative changes. The x-[facility] at [facility] demonstrated severe degenerative changes with full-thickness loss of the articular cartilage. After discussion of the risks and benefits of surgical intervention, the patient expressed his understanding of the risks and benefits and agreed with  plans for surgical intervention.   PROCEDURE: Left total hip arthroplasty.   ANESTHESIA: Spinal.   IMPLANTS UTILIZED: DePuy 15-mm large stature, high offset AML femoral stem, 15-mm outer diameter Pinnacle, 100 acetabular component, +4-mm neutral Pinnacle Marathon polyethylene liner, and a 36-mm M-Spec femoral head with a +1.5 mm neck length.   HOSPITAL COURSE: The patient tolerated the procedure very well. He had no complications. He was then taken to the PACU where he was stabilized and then transferred to the orthopedic floor. He began receiving anticoagulation therapy of Lovenox 30 mg subcutaneous q.12 hours per anesthesia and pharmacy protocol. He was fitted with TED stockings bilaterally. These are allowed to be removed 1 hour per 8-hour shift. He was also fitted with the AVI compression foot pumps set at 80 mmHg bilaterally. His calves have been nontender. There has been no evidence of any  DVTs. Negative Homan sign. Heels were elevated off the bed using rolled towels.   The patient has denied any chest pains or shortness of breath. Vital signs have been stable. He has been afebrile. Hemodynamically he is stable. No transfusions were given.   Physical therapy was initiated on day 1 for gait training and transfers. Upon being discharged the patient was ambulating greater than 325 feet. He was able go up and down 2 steps 2 times without any problems. Occupational therapy was also initiated on day 1 for ADLs and assistive devices.   The patient's IV, Foley and Hemovac were discontinued on day 2 along with a dressing change. The wound was free of any drainage or signs of infection.   DISPOSITION: The patient is being discharged to home in improved stable condition. He may continue to weight bear as tolerated. Continue using a walker until cleared by physical therapy to go to a quad cane. Staples are to be removed on July 20 and apply Benzoin and Steri-Strips. He is not to take a shower before the staples are removed. He was instructed on wearing the TED stockings during the day but may remove these at night. Recommend that he continue incentive spirometer q.1 hour while awake. Encourage the patient to do cough and deep breathing q.2 hours while awake. He was placed on a regular diet. He has a followup appointment at the [facility] on August 18 at 10 a.m.  He is to call the clinic sooner for any complications or any fevers of greater than 101.5.   ALLERGIES: IODINE, MILK, SHELLFISH.   The patient may resume his regular medications that he was on prior to admission. He was given a prescription for  oxycodone 5-10 mg q.4-6 hours p.r.n. for pain, Ultram 50 to 100 mg q.4-6  hours p.r.n. for pain and Lovenox 40 mg subcutaneously daily for 14 days, then discontinue and begin taking one 81 mg enteric-coated aspirin.    PAST MEDICAL HISTORY:  COPD, ankylosing spondylitis, colon cancer,  osteoarthritis, kidney stones, AVN left hip.    ____________________________ Vance Peper, PA jrw:lt D: 10/27/2013 07:26:11 ET T: 10/27/2013 08:25:35 ET JOB#: [id number]  cc: Vance Peper, PA, <Dictator> JON WOLFE PA ELECTRONICALLY SIGNED 11/01/2013 7:22

## 2014-08-12 NOTE — Op Note (Signed)
PATIENT NAME:  Albert Moore, Albert Moore MR#:  [id number] DATE OF BIRTH:  [birth year]  DATE OF PROCEDURE:  10/24/2013  PREOPERATIVE DIAGNOSIS: Degenerative arthrosis of the left hip.   POSTOPERATIVE DIAGNOSIS: Degenerative arthrosis of the left hip.   PROCEDURE PERFORMED: Left total hip arthroplasty.   SURGEON:  Dr. Skip Estimable   ASSISTANT:  Vance Peper, PA (required to maintain retraction throughout the procedure)   ANESTHESIA: Spinal.   ESTIMATED BLOOD LOSS: 100 mL.   FLUIDS REPLACED: 2200 mL of crystalloid.   DRAINS: Two medium drains to Hemovac reservoir.   IMPLANTS UTILIZED: DePuy 15 mm large stature high offset AML femoral stem, 58 mm outer diameter Pinnacle, 100 acetabular component, +4 mm neutral Pinnacle Marathon polyethylene liner, and a 36 mm M-SPEC femoral head with a +1.5 mm neck length.   INDICATIONS FOR SURGERY: The patient is a 74 year old male who has been seen for complaints of progressive left hip and groin pain. X-rays demonstrated severe degenerative changes with full-thickness loss of articular cartilage. After discussion of the risks and benefits of surgical intervention, the patient expressed understanding of the risks, benefits, and agreed with plans for surgical intervention.   PROCEDURE IN DETAIL: The patient was brought into the operating room, and after adequate spinal anesthesia was achieved, the patient was placed in a right lateral decubitus position. Axillary roll was placed and all bony prominences were well padded. The patient's left hip and leg were cleaned and prepped with alcohol and DuraPrep draped in the usual sterile fashion. A "timeout" was performed as per usual protocol. A lateral curvilinear incision was made gently curving towards the posterior superior iliac spine. IT band was incised in line with the skin incision. Fibers of the gluteus maximus were split in line. Piriformis tendon was identified, skeletonized, and incised at its insertion in the proximal  femur and reflected posteriorly. In a similar fashion, the short external rotators were incised and reflected posteriorly. A T-type posterior capsulotomy was performed. A large effusion was evacuated. Prior to dislocation of the femoral head, a threaded Steinmann pin was inserted through a separate stab incision into the pelvis superior to the acetabulum and bent in the form of a stylus so as to assess limb length and hip offset throughout the procedure. The femoral head was then dislocated posteriorly. Inspection of the head demonstrated severe degenerative changes with full-thickness loss of articular cartilage and flattening of the superior aspect of the head. The femoral neck cut was performed using an oscillating saw. The anterior capsule was elevated off the femoral neck. Inspection of the acetabulum also demonstrated significant degenerative changes. The labrum was excised. The acetabulum was reamed in a sequential fashion up to a 57 mm diameter. Good punctate bleeding bone was encountered. Several small cysts were debrided. A 58 mm outer diameter Pinnacle 100 acetabular component was positioned and impacted into place. Excellent scratch fit was appreciated. A +4 mm neutral polyethylene trial was placed and attention was directed to the proximal femur. Pilot hole for reaming of the proximal femoral canal was created using a high-speed bur. The proximal femoral canal was reamed in a sequential fashion up to a 14.5 mm diameter. This allowed for approximately 6 cm of scratch fit. The calcar region was prepared using a 50-mm aggressive side-biting reamer. Serial broaches were inserted up to a 15 mm large stature broach. A 36 mm +1.5 mm hip ball was placed and the hip was placed through a range of motion. Excellent stability was appreciated and good equalization of limb  lengths was noted. There was a slight decrease in the hip offset. The standard neck segment was replaced with a high offset neck segment and hip  was reduced with the 36 mm hip ball with a +1.5 mm neck length. This allowed for good equalization of limb lengths and excellent hip offset. Excellent stability was appreciated both anteriorly and posteriorly. The trials were removed. A +4 mm neutral Pinnacle Marathon polyethylene insert was positioned and impacted into place. Next, a 15 mm large stature high offset AML femoral component was positioned and impacted into place. Excellent scratch fit was appreciated. Trial reduction was again performed with a 36 mm hip ball with a +1.5 mm neck length with good equalization of limb lengths and excellent stability both anteriorly and posteriorly. Trial hip ball was removed. The Morse taper was cleaned and dried. A 36 mm M-SPEC femoral head with a +1.5 mm neck length was placed on the trunnion and impacted into place. The hip was reduced and placed through range of motion. Again, good equalization of limb lengths and excellent stability was noted both anteriorly and posteriorly. The wound was irrigated with copious amounts of normal saline with antibiotic solution using pulsatile lavage and then suctioned dry. Good hemostasis was noted. The posterior capsulotomy was repaired using #5 Ethibond. The piriformis tendon was reapproximated on the surface of the gluteus medius tendon using #5 Ethibond. Two medium drains were placed and brought out through a separate stab incision to be attached to a Hemovac reservoir. The IT band was repaired using interrupted sutures of #1 Vicryl. The subcutaneous tissue was approximated in layers using 1st #0 Vicryl followed by #2-0 Vicryl. Skin was closed with skin staples. A sterile dressing was applied.   The patient tolerated the procedure well. He was transported to the recovery room in stable condition.    ____________________________ Laurice Record. Holley Bouche., MD jph:dd D: 10/24/2013 14:43:57 ET T: 10/25/2013 02:06:42 ET JOB#: [id number]  cc: Laurice Record. Holley Bouche., MD,  <Dictator> Laurice Record Holley Bouche MD ELECTRONICALLY SIGNED 11/08/2013 19:43

## 2014-12-26 ENCOUNTER — Telehealth: Payer: Self-pay | Admitting: Hematology

## 2014-12-26 NOTE — Telephone Encounter (Signed)
Moved 9/19 appointments from [facility] to [place]. Left message for patient and mailed schedule.

## 2015-01-08 ENCOUNTER — Ambulatory Visit (HOSPITAL_BASED_OUTPATIENT_CLINIC_OR_DEPARTMENT_OTHER): Payer: Medicare PPO

## 2015-01-08 ENCOUNTER — Other Ambulatory Visit: Payer: Medicare PPO

## 2015-01-08 ENCOUNTER — Ambulatory Visit (HOSPITAL_BASED_OUTPATIENT_CLINIC_OR_DEPARTMENT_OTHER): Payer: Medicare PPO | Admitting: Hematology

## 2015-01-08 ENCOUNTER — Telehealth: Payer: Self-pay | Admitting: Hematology

## 2015-01-08 VITALS — BP 121/65 | HR 68 | Temp 98.1°F | Resp 17 | Ht 74.0 in | Wt 203.1 lb

## 2015-01-08 DIAGNOSIS — C20 Malignant neoplasm of rectum: Secondary | ICD-10-CM

## 2015-01-08 DIAGNOSIS — T451X5A Adverse effect of antineoplastic and immunosuppressive drugs, initial encounter: Secondary | ICD-10-CM

## 2015-01-08 DIAGNOSIS — G620 Drug-induced polyneuropathy: Secondary | ICD-10-CM

## 2015-01-08 LAB — CBC & DIFF AND RETIC
BASO%: 0.5 % (ref 0.0–2.0)
BASOS ABS: 0 10*3/uL (ref 0.0–0.1)
EOS ABS: 0.1 10*3/uL (ref 0.0–0.5)
EOS%: 2.1 % (ref 0.0–7.0)
HCT: 45.1 % (ref 38.4–49.9)
HEMOGLOBIN: 15.6 g/dL (ref 13.0–17.1)
IMMATURE RETIC FRACT: 4.1 % (ref 3.00–10.60)
LYMPH%: 18.2 % (ref 14.0–49.0)
MCH: 32.6 pg (ref 27.2–33.4)
MCHC: 34.6 g/dL (ref 32.0–36.0)
MCV: 94.4 fL (ref 79.3–98.0)
MONO#: 0.5 10*3/uL (ref 0.1–0.9)
MONO%: 8.1 % (ref 0.0–14.0)
NEUT#: 4.7 10*3/uL (ref 1.5–6.5)
NEUT%: 71.1 % (ref 39.0–75.0)
NRBC: 0 % (ref 0–0)
Platelets: 167 10*3/uL (ref 140–400)
RBC: 4.78 10*6/uL (ref 4.20–5.82)
RDW: 13.1 % (ref 11.0–14.6)
RETIC CT ABS: 58.32 10*3/uL (ref 34.80–93.90)
Retic %: 1.22 % (ref 0.80–1.80)
WBC: 6.6 10*3/uL (ref 4.0–10.3)
lymph#: 1.2 10*3/uL (ref 0.9–3.3)

## 2015-01-08 LAB — COMPREHENSIVE METABOLIC PANEL (CC13)
ALBUMIN: 3.8 g/dL (ref 3.5–5.0)
ALT: 17 U/L (ref 0–55)
ANION GAP: 6 meq/L (ref 3–11)
AST: 16 U/L (ref 5–34)
Alkaline Phosphatase: 60 U/L (ref 40–150)
BUN: 12.8 mg/dL (ref 7.0–26.0)
CALCIUM: 9.5 mg/dL (ref 8.4–10.4)
CHLORIDE: 106 meq/L (ref 98–109)
CO2: 27 mEq/L (ref 22–29)
Creatinine: 0.9 mg/dL (ref 0.7–1.3)
EGFR: 80 mL/min/{1.73_m2} — ABNORMAL LOW (ref >90)
Glucose: 88 mg/dl (ref 70–140)
POTASSIUM: 4.7 meq/L (ref 3.5–5.1)
SODIUM: 139 meq/L (ref 136–145)
TOTAL PROTEIN: 6.6 g/dL (ref 6.4–8.3)
Total Bilirubin: 1.08 mg/dL (ref 0.20–1.20)

## 2015-01-08 MED ORDER — CALCIUM POLYCARBOPHIL 625 MG PO TABS
625.0000 mg | ORAL_TABLET | Freq: Two times a day (BID) | ORAL | Status: AC
Start: 2015-01-08 — End: ?

## 2015-01-08 MED ORDER — DULOXETINE HCL 30 MG PO CPEP: 30.0000 mg | ORAL_CAPSULE | Freq: Every day | ORAL | Status: DC

## 2015-01-08 MED ORDER — MELOXICAM 7.5 MG PO TABS: 7.5000 mg | ORAL_TABLET | Freq: Two times a day (BID) | ORAL | Status: AC

## 2015-01-08 NOTE — Telephone Encounter (Signed)
per po fto sch pt appt-gave pt copy of avs-sent back to lab °

## 2015-01-10 LAB — COPPER, SERUM: Copper: 75 ug/dL (ref 70–175)

## 2015-01-10 LAB — CEA: CEA: 0.6 ng/mL (ref 0.0–5.0)

## 2015-01-10 LAB — VITAMIN B12: VITAMIN B 12: 308 pg/mL (ref 211–911)

## 2015-01-14 ENCOUNTER — Encounter: Payer: Self-pay | Admitting: Hematology

## 2015-01-14 NOTE — Progress Notes (Signed)
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 The patient underwent low anterior resection on 02/15/2009. He was found to have 2/32 positive lymph nodes with extracapsular spread and 1 mesenteric focus of tumor. Pathologic stage was T3 N1c, IIIB. The patient underwent 12 cycles of FOLFOX from May 23, 2009 through October 29, 2009, then received pelvic radiation in conjunction with continuous infusion 5 fluorouracil from 12/04/2009 through 01/15/2010. He received a cumulative dose of 5400 cGy in 30 sessions.   Current Treatment: Observation  Interval History Albert Moore is a wonderful 74 y.o. 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 Albert Moore was last seen by Dr Lona Kettle on 01/10/2014. He notes no change in bowel habits but still with stool urgency and loose stools.  No melena/hematochezia. No weight loss. Notes that he is still suffering greatly from [facility] related pain peripheral neuropathy. He notes that he is taking about 1800 mg po gabapentin  He notes that he tried lyrica without any effect. Has never tried Cymbalta and is willing to try it. Notes that he is hesitant to use opiates for pain control.    MEDICAL HISTORY:  Past Medical History  Diagnosis Date  . Arthritis   . Asthma   . Colorectal cancer   . Depression   . Neuropathy     SURGICAL HISTORY: Past Surgical History  Procedure Laterality Date  . Appendectomy    . Colon resection    . Fetal surgery for congenital hernia    . Total hip arthroplasty Left     SOCIAL HISTORY: Social History   Social History  . Marital  Status: Single    Spouse Name: N/A  . Number of Children: 1  . Years of Education: N/A   Occupational History  . Retired    Social History Main Topics  . Smoking status: Former Smoker    Types: Cigarettes    Quit date: 05/11/2013  . Smokeless tobacco: Never Used  . Alcohol Use: Yes     Comment: rarely  . Drug Use: No  . Sexual Activity: Not on file   Other Topics Concern  . Not on file   Social History Narrative  . No narrative on file    FAMILY HISTORY: Family History  Problem Relation Age of Onset  . Breast cancer Mother   . Pancreatic cancer Father     ALLERGIES:  is allergic to shellfish allergy.  MEDICATIONS:  Current Outpatient Prescriptions  Medication Sig Dispense Refill  . Cyanocobalamin (VITAMIN B 12 PO) Take 1 tablet by mouth daily.    . DULoxetine (CYMBALTA) 30 MG capsule Take 1 capsule (30 mg total) by mouth daily. Might increase to 60 mg daily in 7 days if tolerating without any issues. 60 capsule 0  . loperamide (IMODIUM) 2 MG capsule Take 2 mg by mouth daily.    . meloxicam (MOBIC) 7.5 MG tablet Take 1 tablet (7.5 mg total) by mouth 2 (two) times daily. 60 tablet 0  . polycarbophil (FIBERCON) 625 MG tablet Take 1-2 tablets (625-1,250 mg total) by mouth 2 (two) times daily. 60 tablet 1  . prednisoLONE acetate (PRED  FORTE) 1 % ophthalmic suspension Place 1 drop into the left eye daily.    . pregabalin (LYRICA) 75 MG capsule Take 1 capsule (75 mg total) by mouth 3 (three) times daily. 90 capsule 0  . Tamsulosin HCl (FLOMAX) 0.4 MG CAPS Take 1 capsule (0.4 mg total) by mouth daily after supper. 30 capsule 5   No current facility-administered medications for this visit.    REVIEW OF SYSTEMS:    10 Point review of Systems was done is negative except as noted above.  PHYSICAL EXAMINATION: ECOG PERFORMANCE STATUS: 1 - Symptomatic but completely ambulatory  . Filed Vitals:   01/08/15 0935  Height: 6\' 2"  (1.88 m)  Weight: 203 lb 1.6 oz (92.126 kg)    Filed Weights   01/08/15 0935  Weight: 203 lb 1.6 oz (92.126 kg)   .Body mass index is 26.07 kg/(m^2).  GENERAL:alert, in no acute distress and comfortable SKIN: skin color, texture, turgor are normal, no rashes or significant lesions EYES: normal, conjunctiva are pink and non-injected, sclera clear OROPHARYNX:no exudate, no erythema and lips, buccal mucosa, and tongue normal  NECK: supple, no JVD, thyroid normal size, non-tender, without nodularity LYMPH:  no palpable lymphadenopathy in the cervical, axillary or inguinal LUNGS: clear to auscultation with normal respiratory effort HEART: regular rate & rhythm,  no murmurs and no lower extremity edema ABDOMEN: abdomen soft, non-tender, normoactive bowel sounds  Musculoskeletal: no cyanosis of digits and no clubbing  PSYCH: alert & oriented x 3 with fluent speech NEURO: no focal motor/sensory deficits  LABORATORY DATA:  I have reviewed the data as listed  . CBC Latest Ref Rng 01/08/2015 01/10/2014 10/26/2013  WBC 4.0 - 10.3 10e3/uL 6.6 6.2 -  Hemoglobin 13.0 - 17.1 g/dL 15.6 15.0 11.9(L)  Hematocrit 38.4 - 49.9 % 45.1 45.0 -  Platelets 140 - 400 10e3/uL 167 205 168    . CMP Latest Ref Rng 01/08/2015 01/10/2014 10/26/2013  Glucose 70 - 140 mg/dl 88 121 95  BUN 7.0 - 26.0 mg/dL 12.8 8.2 7  Creatinine 0.7 - 1.3 mg/dL 0.9 0.9 0.85  Sodium 136 - 145 mEq/L 139 138 135(L)  Potassium 3.5 - 5.1 mEq/L 4.7 4.2 3.8  Chloride 98-107 mmol/L - - 101  CO2 22 - 29 mEq/L 27 24 26   Calcium 8.4 - 10.4 mg/dL 9.5 9.2 8.4(L)  Total Protein 6.4 - 8.3 g/dL 6.6 6.7 -  Total Bilirubin 0.20 - 1.20 mg/dL 1.08 0.55 -  Alkaline Phos 40 - 150 U/L 60 75 -  AST 5 - 34 U/L 16 13 -  ALT 0 - 55 U/L 17 8 -      RADIOGRAPHIC STUDIES: I have personally reviewed the radiological images as listed and agreed with the findings in the report. No results found.  ASSESSMENT & PLAN:    Albert Moore 74 y.o. male with a history of Rectal cancer in remission for 6  years.   1. 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 The patient underwent low anterior resection on 02/15/2009. He was found to have 2/32 positive lymph nodes with extracapsular spread and 1 mesenteric focus of tumor. Pathologic stage was T3 N1c, IIIB. The patient underwent 12 cycles of FOLFOX from May 23, 2009 through October 29, 2009, then received pelvic radiation in conjunction with continuous infusion 5 fluorouracil from 12/04/2009 through 01/15/2010. He received a cumulative dose of 5400 cGy in 30 sessions.   No clinical evidence of  recurrent colo-rectal cancer at this time. Colonoscopy 12/2013 was neg for cancer - recommended rpt colonscopy in 5 yrs unless new symptoms. 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  Notes he is still on gabapentin 600mg  po TID with minimal relief. Tried lyrica without any  Benefits Plan -willing to try Cymbalta - given prescription ---will wean down gabapentin if Cymbalta helpful.   RTC in 1 month to reassess management of his peripheral neuropathy.  I spent 20 minutes counseling the patient face to face. The total time spent in the appointment was 25 minutes. All of the patients questions were answered with apparent satisfaction. The patient knows to call the clinic with any problems, questions or concerns.    Sullivan Lone MD [place] AAHIVMS [facility] Hematology/Oncology Physician [facility]  (Office):       [phone] (Work cell):  [phone] (Fax):           [phone]

## 2015-02-07 ENCOUNTER — Ambulatory Visit: Payer: Medicare PPO | Admitting: Hematology

## 2015-02-20 ENCOUNTER — Other Ambulatory Visit: Payer: Self-pay | Admitting: Hematology

## 2015-02-20 ENCOUNTER — Telehealth: Payer: Self-pay | Admitting: Hematology

## 2015-02-20 ENCOUNTER — Ambulatory Visit (HOSPITAL_BASED_OUTPATIENT_CLINIC_OR_DEPARTMENT_OTHER): Payer: Medicare PPO | Admitting: Hematology

## 2015-02-20 ENCOUNTER — Encounter: Payer: Self-pay | Admitting: Hematology

## 2015-02-20 VITALS — BP 122/74 | HR 95 | Temp 97.8°F | Resp 18 | Ht 74.0 in | Wt 203.6 lb

## 2015-02-20 DIAGNOSIS — T451X5A Adverse effect of antineoplastic and immunosuppressive drugs, initial encounter: Principal | ICD-10-CM

## 2015-02-20 DIAGNOSIS — G620 Drug-induced polyneuropathy: Secondary | ICD-10-CM | POA: Diagnosis not present

## 2015-02-20 DIAGNOSIS — Z85048 Personal history of other malignant neoplasm of rectum, rectosigmoid junction, and anus: Secondary | ICD-10-CM

## 2015-02-20 MED ORDER — GABAPENTIN 600 MG PO TABS: 600.0000 mg | ORAL_TABLET | Freq: Three times a day (TID) | ORAL | Status: AC

## 2015-02-20 MED ORDER — DULOXETINE HCL 30 MG PO CPEP: 60.0000 mg | ORAL_CAPSULE | Freq: Every day | ORAL | Status: AC

## 2015-02-20 NOTE — Telephone Encounter (Signed)
cld pt to adv of appt-pt req to mail copy of avs

## 2015-03-07 NOTE — Progress Notes (Signed)
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 The patient underwent low anterior resection on 02/15/2009. He was found to have 2/32 positive lymph nodes with extracapsular spread and 1 mesenteric focus of tumor. Pathologic stage was T3 N1c, IIIB. The patient underwent 12 cycles of FOLFOX from May 23, 2009 through October 29, 2009, then received pelvic radiation in conjunction with continuous infusion 5 fluorouracil from 12/04/2009 through 01/15/2010. He received a cumulative dose of 5400 cGy in 30 sessions.  Current Treatment: Observation  2) Chemotherapy-induced neuropathy\ Current Treatment: -Neurontin -Cymbalta   Interval History  Albert Moore is here for follow-up for reassessment of his chemotherapy related neuropathy. He notes that the Cymbalta has certainly became quite useful and that he has been able to sleep through the night much better without waking up due to painful paresthesias. He notes that he was not able to wean down the Neurontin without worsening his neuropathy. We talked about the pros and cons of further increase in the Cymbalta dose and discuss that the likely additional benefits be relatively low for the potential adverse effects. Patient  Notes that his quality of life is reasonable with this current combination of medications and that he would like to continue this. No other acute new concerns. Notes that he is enjoying life the best he can.   MEDICAL HISTORY:  Past Medical History  Diagnosis Date  . Arthritis   . Asthma   . Colorectal cancer ([place])   . Depression   . Neuropathy ([place])     SURGICAL HISTORY: Past Surgical History  Procedure Laterality  Date  . Appendectomy    . Colon resection    . Fetal surgery for congenital hernia    . Total hip arthroplasty Left     SOCIAL HISTORY: Social History   Social History  . Marital Status: Single    Spouse Name: N/A  . Number of Children: 1  . Years of Education: N/A   Occupational History  . Retired    Social History Main Topics  . Smoking status: Former Smoker    Types: Cigarettes    Quit date: 05/11/2013  . Smokeless tobacco: Never Used  . Alcohol Use: Yes     Comment: rarely  . Drug Use: No  . Sexual Activity: Not on file   Other Topics Concern  . Not on file   Social History Narrative    FAMILY HISTORY: Family History  Problem Relation Age of Onset  . Breast cancer Mother   . Pancreatic cancer Father     ALLERGIES:  is allergic to shellfish allergy.  MEDICATIONS:  Current Outpatient Prescriptions  Medication Sig Dispense Refill  . Cyanocobalamin (VITAMIN B 12 PO) Take 1 tablet by mouth daily.    . DULoxetine (CYMBALTA) 30 MG capsule Take 2 capsules (60 mg total) by mouth daily. 60 capsule 3  . loperamide (IMODIUM) 2 MG capsule Take 2 mg by mouth daily.    . meloxicam (MOBIC) 7.5 MG tablet Take 1 tablet (7.5 mg total) by mouth 2 (two) times daily. 60 tablet 0  . polycarbophil (FIBERCON) 625 MG tablet Take 1-2 tablets (625-1,250 mg total) by mouth 2 (two) times daily. 60 tablet 1  . prednisoLONE acetate (  PRED FORTE) 1 % ophthalmic suspension Place 1 drop into the left eye daily.    . Tamsulosin HCl (FLOMAX) 0.4 MG CAPS Take 1 capsule (0.4 mg total) by mouth daily after supper. 30 capsule 5  . gabapentin (NEURONTIN) 600 MG tablet Take 1 tablet (600 mg total) by mouth 3 (three) times daily.     No current facility-administered medications for this visit.    REVIEW OF SYSTEMS:    10 Point review of Systems was done is negative except as noted above.  PHYSICAL EXAMINATION: ECOG PERFORMANCE STATUS: 1 - Symptomatic but completely ambulatory  . Filed  Vitals:   02/20/15 0936  Height: 6\' 2"  (1.88 m)  Weight: 203 lb 9.6 oz (92.352 kg)   Filed Weights   02/20/15 0936  Weight: 203 lb 9.6 oz (92.352 kg)   .Body mass index is 26.13 kg/(m^2).  GENERAL:alert, in no acute distress and comfortable SKIN: skin color, texture, turgor are normal, no rashes or significant lesions EYES: normal, conjunctiva are pink and non-injected, sclera clear OROPHARYNX:no exudate, no erythema and lips, buccal mucosa, and tongue normal  NECK: supple, no JVD, thyroid normal size, non-tender, without nodularity LYMPH:  no palpable lymphadenopathy in the cervical, axillary or inguinal LUNGS: clear to auscultation with normal respiratory effort HEART: regular rate & rhythm,  no murmurs and no lower extremity edema ABDOMEN: abdomen soft, non-tender, normoactive bowel sounds  Musculoskeletal: no cyanosis of digits and no clubbing  PSYCH: alert & oriented x 3 with fluent speech NEURO: no focal motor/sensory deficits  LABORATORY DATA:  I have reviewed the data as listed  . CBC Latest Ref Rng 01/08/2015 01/10/2014 10/26/2013  WBC 4.0 - 10.3 10e3/uL 6.6 6.2 -  Hemoglobin 13.0 - 17.1 g/dL 15.6 15.0 11.9(L)  Hematocrit 38.4 - 49.9 % 45.1 45.0 -  Platelets 140 - 400 10e3/uL 167 205 168    . CMP Latest Ref Rng 01/08/2015 01/10/2014 10/26/2013  Glucose 70 - 140 mg/dl 88 121 95  BUN 7.0 - 26.0 mg/dL 12.8 8.2 7  Creatinine 0.7 - 1.3 mg/dL 0.9 0.9 0.85  Sodium 136 - 145 mEq/L 139 138 135(L)  Potassium 3.5 - 5.1 mEq/L 4.7 4.2 3.8  Chloride 98-107 mmol/L - - 101  CO2 22 - 29 mEq/L 27 24 26   Calcium 8.4 - 10.4 mg/dL 9.5 9.2 8.4(L)  Total Protein 6.4 - 8.3 g/dL 6.6 6.7 -  Total Bilirubin 0.20 - 1.20 mg/dL 1.08 0.55 -  Alkaline Phos 40 - 150 U/L 60 75 -  AST 5 - 34 U/L 16 13 -  ALT 0 - 55 U/L 17 8 -      RADIOGRAPHIC STUDIES: I have personally reviewed the radiological images as listed and agreed with the findings in the report. No results found.  ASSESSMENT & PLAN:     Albert Moore 74 y.o. male with a history of Rectal cancer in remission for 6 years.   1. 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 The patient underwent low anterior resection on 02/15/2009. He was found to have 2/32 positive lymph nodes with extracapsular spread and 1 mesenteric focus of tumor. Pathologic stage was T3 N1c, IIIB. The patient underwent 12 cycles of FOLFOX from May 23, 2009 through October 29, 2009, then received pelvic radiation in conjunction with continuous infusion 5 fluorouracil from 12/04/2009 through 01/15/2010. He received a cumulative dose of 5400 cGy in 30 sessions.   No clinical evidence of  recurrent colo-rectal cancer at this time. Colonoscopy 12/2013 was neg for cancer - recommended rpt colonscopy in 5 yrs unless new symptoms. 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  Notes he is still on gabapentin 600mg  po TID with minimal relief. Tried lyrica without any benefits in the past. Notes that adding Cymbalta 60 mg by mouth daily has really helped him. However he has not been able to wean down his Neurontin without worsening his neuropathic pain. Plan -continue current dose of Cymbalta 60 mg by mouth daily. -Continue Neurontin 600 mg by mouth 3 times a day -Continue monitoring medications with primary care physician as per patient's preference.  RTC in 12 month for continued follow-up for his rectal cancer. Earlier if any acute new concerns or questions arise.  I spent 15 minutes counseling the patient face to face. The total time spent in the appointment was 15 minutes. All of the patients questions were answered with apparent satisfaction. The patient knows to call the clinic with any problems, questions or concerns.    Sullivan Lone MD [place] AAHIVMS [facility] Hematology/Oncology Physician [facility]  (Office):        [phone] (Work cell):  [phone] (Fax):           [phone]

## 2015-04-21 ENCOUNTER — Other Ambulatory Visit: Payer: Self-pay | Admitting: Hematology

## 2015-04-25 DIAGNOSIS — H52221 Regular astigmatism, right eye: Secondary | ICD-10-CM | POA: Diagnosis not present

## 2015-04-25 DIAGNOSIS — Z9849 Cataract extraction status, unspecified eye: Secondary | ICD-10-CM | POA: Diagnosis not present

## 2015-04-25 DIAGNOSIS — Z947 Corneal transplant status: Secondary | ICD-10-CM | POA: Diagnosis not present

## 2015-05-19 DIAGNOSIS — M1990 Unspecified osteoarthritis, unspecified site: Secondary | ICD-10-CM | POA: Diagnosis not present

## 2015-05-19 DIAGNOSIS — R32 Unspecified urinary incontinence: Secondary | ICD-10-CM | POA: Diagnosis not present

## 2015-05-19 DIAGNOSIS — H9190 Unspecified hearing loss, unspecified ear: Secondary | ICD-10-CM | POA: Diagnosis not present

## 2015-05-19 DIAGNOSIS — M545 Low back pain: Secondary | ICD-10-CM | POA: Diagnosis not present

## 2015-05-19 DIAGNOSIS — F17290 Nicotine dependence, other tobacco product, uncomplicated: Secondary | ICD-10-CM | POA: Diagnosis not present

## 2015-05-19 DIAGNOSIS — E663 Overweight: Secondary | ICD-10-CM | POA: Diagnosis not present

## 2015-05-19 DIAGNOSIS — N400 Enlarged prostate without lower urinary tract symptoms: Secondary | ICD-10-CM | POA: Diagnosis not present

## 2015-05-19 DIAGNOSIS — J449 Chronic obstructive pulmonary disease, unspecified: Secondary | ICD-10-CM | POA: Diagnosis not present

## 2015-05-19 DIAGNOSIS — G629 Polyneuropathy, unspecified: Secondary | ICD-10-CM | POA: Diagnosis not present

## 2015-05-29 DIAGNOSIS — H905 Unspecified sensorineural hearing loss: Secondary | ICD-10-CM | POA: Diagnosis not present

## 2015-06-07 DIAGNOSIS — Z0000 Encounter for general adult medical examination without abnormal findings: Secondary | ICD-10-CM | POA: Diagnosis not present

## 2015-06-07 DIAGNOSIS — Z85038 Personal history of other malignant neoplasm of large intestine: Secondary | ICD-10-CM | POA: Diagnosis not present

## 2015-06-07 DIAGNOSIS — Z13220 Encounter for screening for lipoid disorders: Secondary | ICD-10-CM | POA: Diagnosis not present

## 2015-06-07 DIAGNOSIS — Z96642 Presence of left artificial hip joint: Secondary | ICD-10-CM | POA: Diagnosis not present

## 2015-06-07 DIAGNOSIS — Z125 Encounter for screening for malignant neoplasm of prostate: Secondary | ICD-10-CM | POA: Diagnosis not present

## 2015-06-07 DIAGNOSIS — Z79899 Other long term (current) drug therapy: Secondary | ICD-10-CM | POA: Diagnosis not present

## 2015-06-25 ENCOUNTER — Encounter: Payer: Self-pay | Admitting: Gastroenterology

## 2015-11-22 DIAGNOSIS — Z79899 Other long term (current) drug therapy: Secondary | ICD-10-CM | POA: Diagnosis not present

## 2015-11-29 ENCOUNTER — Other Ambulatory Visit: Payer: Self-pay | Admitting: Family Medicine

## 2015-11-29 DIAGNOSIS — I712 Thoracic aortic aneurysm, without rupture, unspecified: Secondary | ICD-10-CM

## 2015-11-29 DIAGNOSIS — Z13220 Encounter for screening for lipoid disorders: Secondary | ICD-10-CM | POA: Diagnosis not present

## 2015-11-29 DIAGNOSIS — Z79899 Other long term (current) drug therapy: Secondary | ICD-10-CM | POA: Diagnosis not present

## 2015-11-29 DIAGNOSIS — Z125 Encounter for screening for malignant neoplasm of prostate: Secondary | ICD-10-CM | POA: Diagnosis not present

## 2015-11-29 DIAGNOSIS — G629 Polyneuropathy, unspecified: Secondary | ICD-10-CM | POA: Diagnosis not present

## 2015-11-29 DIAGNOSIS — R0609 Other forms of dyspnea: Secondary | ICD-10-CM | POA: Diagnosis not present

## 2015-11-29 DIAGNOSIS — H9202 Otalgia, left ear: Secondary | ICD-10-CM | POA: Diagnosis not present

## 2015-12-06 ENCOUNTER — Ambulatory Visit
Admission: RE | Admit: 2015-12-06 | Discharge: 2015-12-06 | Disposition: A | Discharge status: Home / Self Care | Payer: Commercial Managed Care - HMO | Source: Ambulatory Visit | Attending: Family Medicine | Admitting: Family Medicine

## 2015-12-06 DIAGNOSIS — I712 Thoracic aortic aneurysm, without rupture, unspecified: Secondary | ICD-10-CM

## 2015-12-06 DIAGNOSIS — R918 Other nonspecific abnormal finding of lung field: Secondary | ICD-10-CM | POA: Insufficient documentation

## 2015-12-06 MED ORDER — IOPAMIDOL (ISOVUE-370) INJECTION 76%
75.0000 mL | Freq: Once | INTRAVENOUS | Status: AC | PRN
  Administered 2015-12-06: 75 mL via INTRAVENOUS

## 2016-02-15 ENCOUNTER — Telehealth: Payer: Self-pay | Admitting: Hematology

## 2016-02-15 NOTE — Telephone Encounter (Signed)
[facility] COVERING AP - LAB/FU MOVED FROM 11/1 TO 11/13. NOT ABLE TO REACH PATIENT BY PHONE OR LEAVE MESSAGE.

## 2016-02-20 ENCOUNTER — Ambulatory Visit: Payer: Medicare PPO | Admitting: Hematology

## 2016-02-20 ENCOUNTER — Other Ambulatory Visit: Payer: Medicare PPO

## 2016-03-03 ENCOUNTER — Ambulatory Visit: Payer: Medicare PPO | Admitting: Hematology

## 2016-03-03 ENCOUNTER — Other Ambulatory Visit: Payer: Medicare PPO

## 2016-04-24 DIAGNOSIS — Z947 Corneal transplant status: Secondary | ICD-10-CM | POA: Diagnosis not present

## 2016-04-24 DIAGNOSIS — H30123 Disseminated chorioretinal inflammation, peripheral, bilateral: Secondary | ICD-10-CM | POA: Diagnosis not present

## 2016-04-24 DIAGNOSIS — Z961 Presence of intraocular lens: Secondary | ICD-10-CM | POA: Diagnosis not present

## 2016-04-24 DIAGNOSIS — H52221 Regular astigmatism, right eye: Secondary | ICD-10-CM | POA: Diagnosis not present

## 2016-05-28 DIAGNOSIS — Z79899 Other long term (current) drug therapy: Secondary | ICD-10-CM | POA: Diagnosis not present

## 2016-05-28 DIAGNOSIS — Z13220 Encounter for screening for lipoid disorders: Secondary | ICD-10-CM | POA: Diagnosis not present

## 2016-05-28 DIAGNOSIS — Z125 Encounter for screening for malignant neoplasm of prostate: Secondary | ICD-10-CM | POA: Diagnosis not present

## 2016-06-09 DIAGNOSIS — Z85038 Personal history of other malignant neoplasm of large intestine: Secondary | ICD-10-CM | POA: Diagnosis not present

## 2016-06-09 DIAGNOSIS — Z0000 Encounter for general adult medical examination without abnormal findings: Secondary | ICD-10-CM | POA: Diagnosis not present

## 2017-01-30 IMAGING — CT CT ANGIO CHEST
1 of 2 series · 18 of 32 positions shown · IV contrast (APPLIED)
Comparison: CT chest of 11/13/2009

CLINICAL DATA: Followup of thoracic aortic aneurysm, history of
colon carcinoma diagnosed over 5 years ago

EXAM:
CT ANGIOGRAPHY CHEST WITH CONTRAST
TECHNIQUE: Multidetector CT imaging of the chest was performed using the
standard protocol during bolus administration of intravenous
contrast. Multiplanar CT image reconstructions and MIPs were
obtained to evaluate the vascular anatomy.
CONTRAST:  75 cc Isovue 370

[Series 4: axial arterial · axial · arterial · 0.76mm/px · z∈[-677,-392]mm · 18 of 107 slices shown]
[im 6/107  lung]
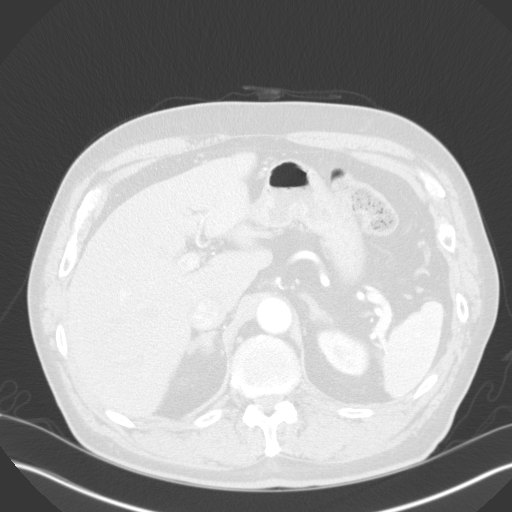
[im 12/107  soft-tissue]
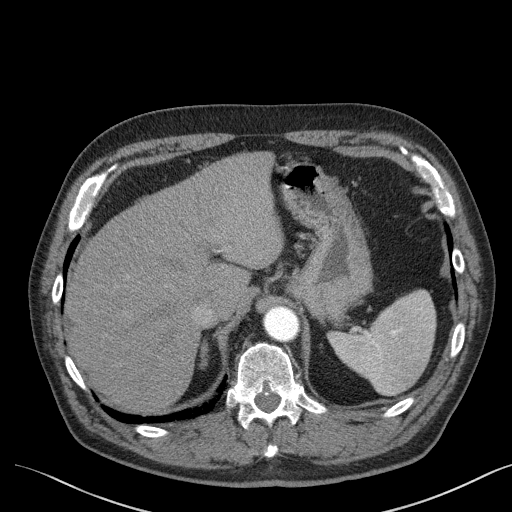
[im 17/107  lung]
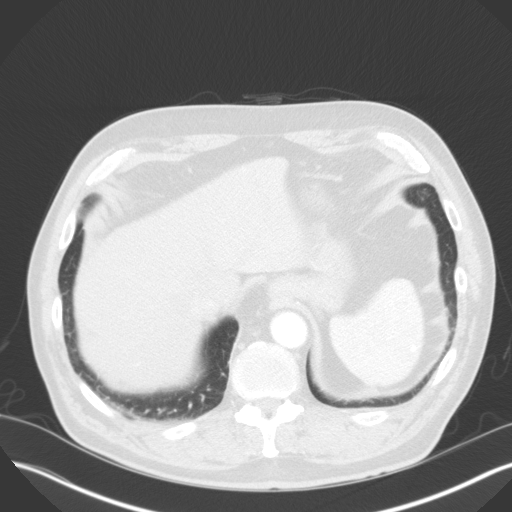
[im 23/107  soft-tissue]
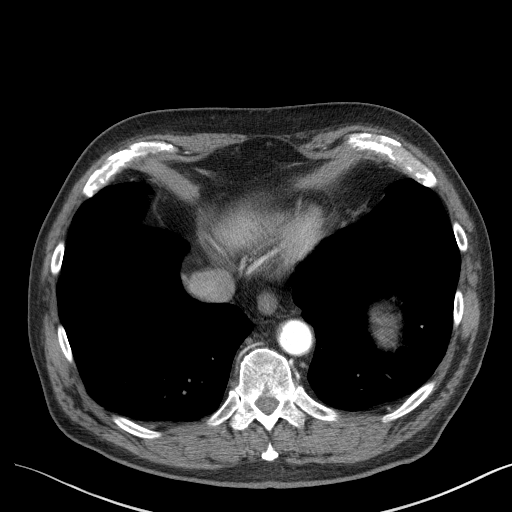
[im 28/107  lung]
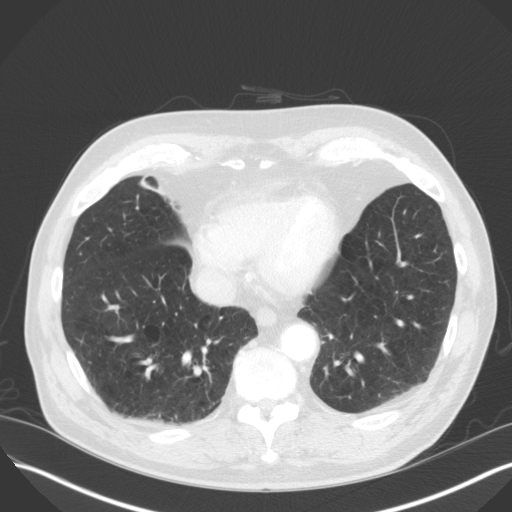
[im 34/107  soft-tissue]
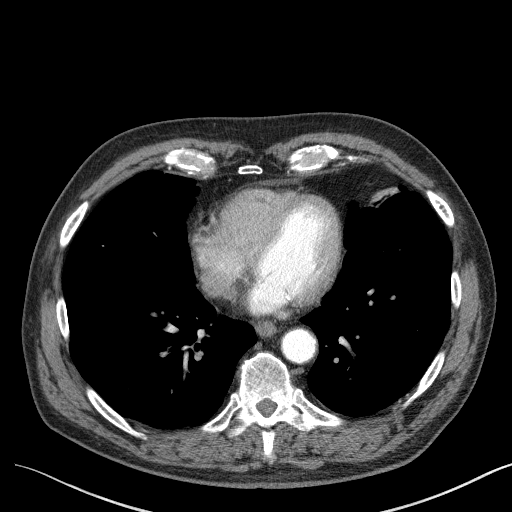
[im 40/107  lung]
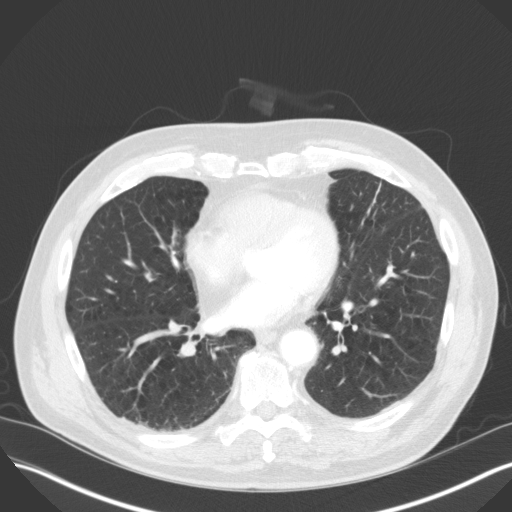
[im 45/107  soft-tissue]
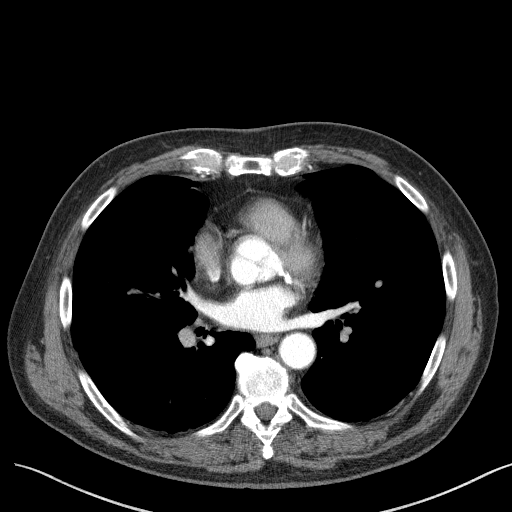
[im 51/107  lung]
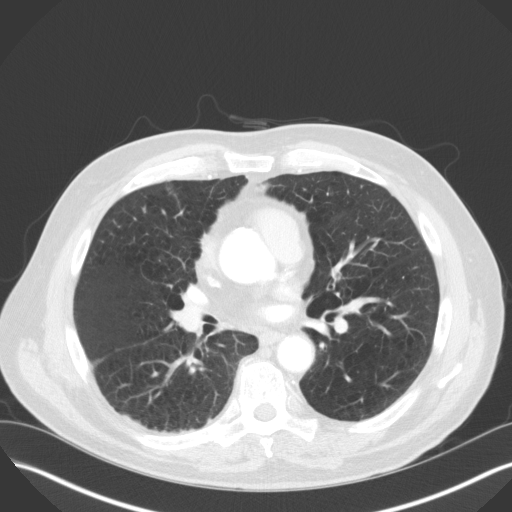
[im 56/107  soft-tissue]
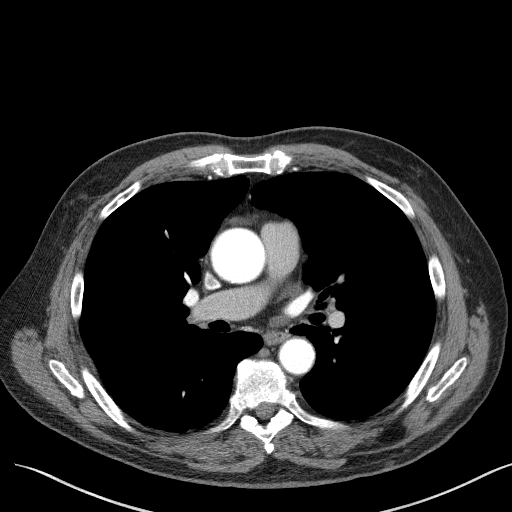
[im 62/107  lung]
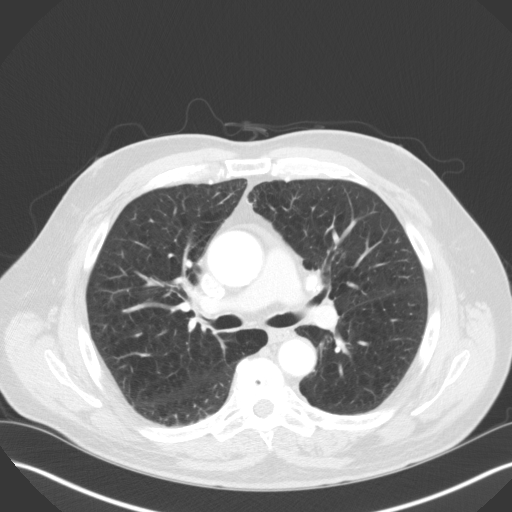
[im 67/107  soft-tissue]
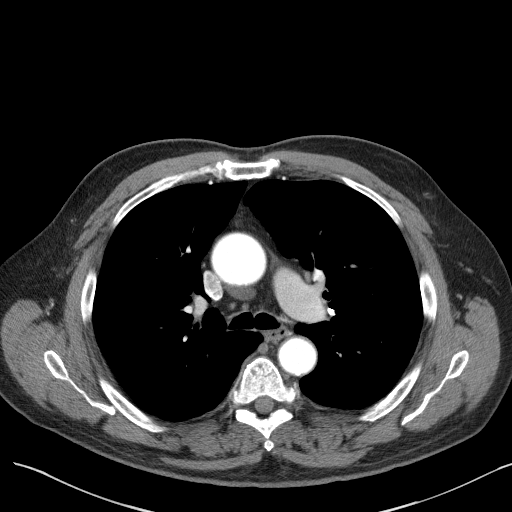
[im 73/107  lung]
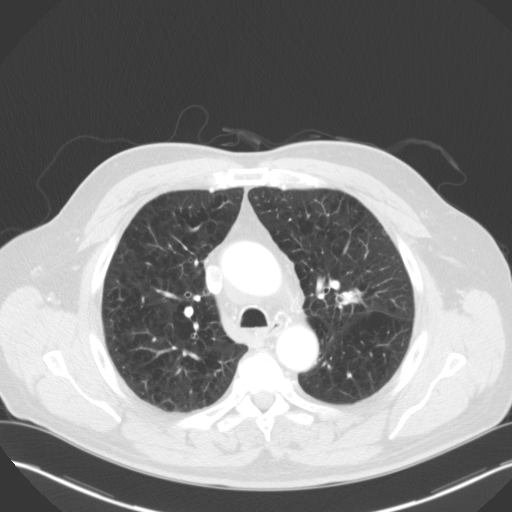
[im 79/107  soft-tissue]
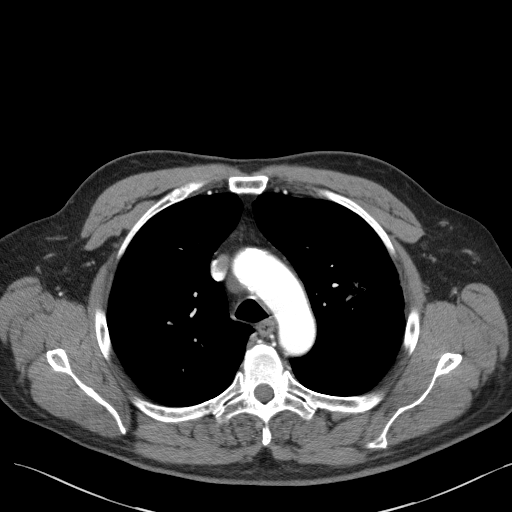
[im 84/107  lung]
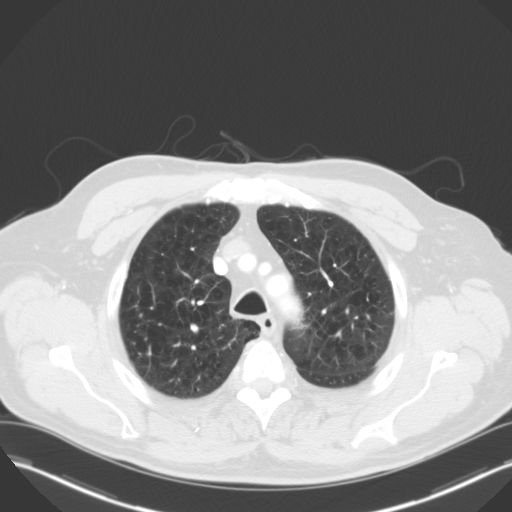
[im 90/107  soft-tissue]
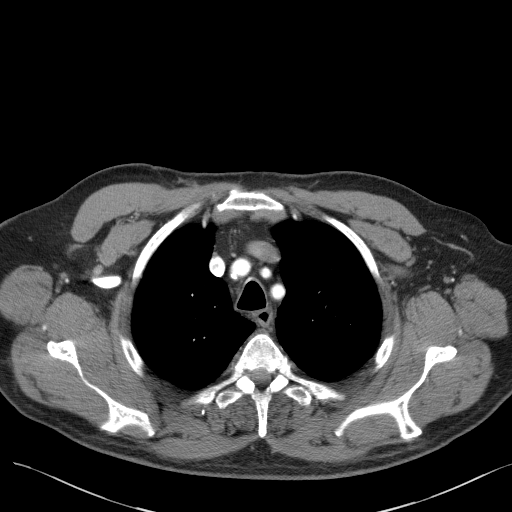
[im 95/107  lung]
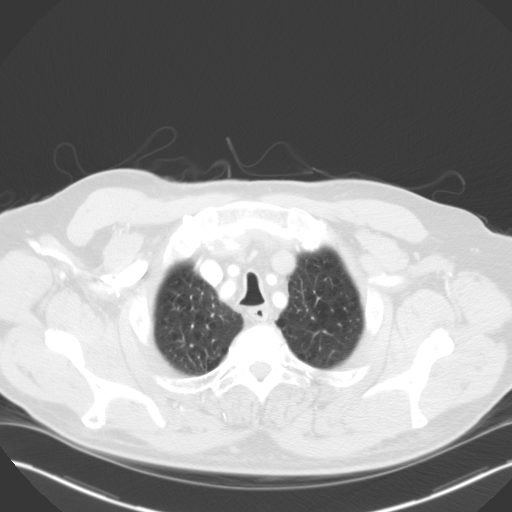
[im 101/107  soft-tissue]
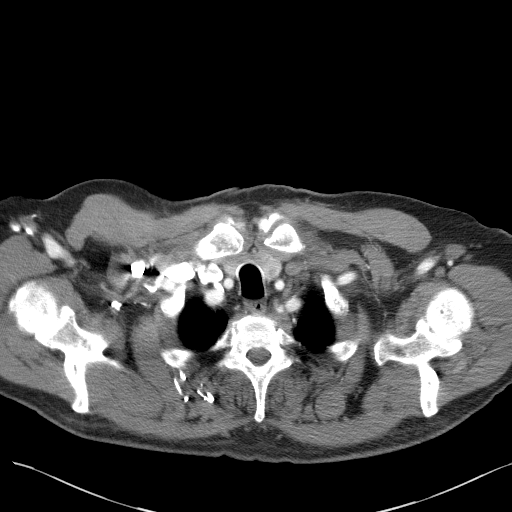

[18 of 32 positions shown; findings below may reference images not displayed]

FINDINGS: Diffuse changes of centrilobular emphysema are noted. However,
within the left upper lobe there is a parenchymal opacity associated
with several calcified granulomas. This opacity could be due to
prior infection and scarring, but the bronchus to the left upper
lobe is not well seen in this region and a small endobronchial
lesion cannot be excluded. PET-CT would be helpful to assess for any
metabolic activity. However, PET-CT would not separate an infectious
or inflammatory process from neoplasm. If less intensive followup is
considered clinically, then followup CT of the chest with IV
contrast in 4 months is recommended. No additional lung parenchymal
lesion is seen. No pleural effusion is noted.

On soft tissue window images, the small left thyroid nodules are
present. There is good opacification of the thoracic aorta. The mid
ascending thoracic aorta measures 4.1 cm in diameter on axial image
49 series 4. The pulmonary arteries are not as well opacified but no
central abnormality is seen. There is some calcification of the
mitral and aortic valves. Calcified left hilar nodes are present
prior granulomatous disease. In addition there are calcified
granulomatous within the liver and spleen.

Within the upper abdomen there is a low-attenuation nodule emanating
from medial limb of the right adrenal consistent with incidental
adrenal adenoma of 15 mm in diameter. There may be a small left
incidental adrenal adenoma as well measuring 11 mm. The thoracic
vertebrae are in normal alignment with normal intervertebral disc
spaces. Moderate thoracic aortic atherosclerosis is noted.

Review of the MIP images confirms the above findings.
IMPRESSION: 1. Opacity within the left upper lobe with adjacent calcified
granulomas could be due to prior infection and scarring, but PET-CT
may be helpful to assess for metabolic activity. Alternatively, CT
chest with IV contrast could be performed in 4 months to assess
stability.
2. The mid ascending thoracic aorta measures 4.1 cm. No interval
enlargement in the ascending aorta is noted.
3. Left upper lobe and left hilar calcified granulomas as well as
calcified granulomas within the spleen and liver consistent with
prior granulomatous disease.

## 2017-07-02 DIAGNOSIS — H52221 Regular astigmatism, right eye: Secondary | ICD-10-CM | POA: Diagnosis not present

## 2017-07-02 DIAGNOSIS — Z9841 Cataract extraction status, right eye: Secondary | ICD-10-CM | POA: Diagnosis not present

## 2017-07-02 DIAGNOSIS — H5212 Myopia, left eye: Secondary | ICD-10-CM | POA: Diagnosis not present

## 2017-07-02 DIAGNOSIS — H353131 Nonexudative age-related macular degeneration, bilateral, early dry stage: Secondary | ICD-10-CM | POA: Diagnosis not present

## 2017-07-02 DIAGNOSIS — Z9842 Cataract extraction status, left eye: Secondary | ICD-10-CM | POA: Diagnosis not present

## 2017-07-02 DIAGNOSIS — B399 Histoplasmosis, unspecified: Secondary | ICD-10-CM | POA: Diagnosis not present

## 2017-07-17 DIAGNOSIS — Z85038 Personal history of other malignant neoplasm of large intestine: Secondary | ICD-10-CM | POA: Diagnosis not present

## 2017-07-17 DIAGNOSIS — Z79899 Other long term (current) drug therapy: Secondary | ICD-10-CM | POA: Diagnosis not present

## 2017-07-17 DIAGNOSIS — Z0000 Encounter for general adult medical examination without abnormal findings: Secondary | ICD-10-CM | POA: Diagnosis not present

## 2017-08-12 DIAGNOSIS — L578 Other skin changes due to chronic exposure to nonionizing radiation: Secondary | ICD-10-CM | POA: Diagnosis not present

## 2017-08-12 DIAGNOSIS — L821 Other seborrheic keratosis: Secondary | ICD-10-CM | POA: Diagnosis not present

## 2017-08-12 DIAGNOSIS — L219 Seborrheic dermatitis, unspecified: Secondary | ICD-10-CM | POA: Diagnosis not present

## 2017-08-12 DIAGNOSIS — D1800 Hemangioma unspecified site: Secondary | ICD-10-CM | POA: Diagnosis not present

## 2017-08-12 DIAGNOSIS — L720 Epidermal cyst: Secondary | ICD-10-CM | POA: Diagnosis not present

## 2017-08-12 DIAGNOSIS — L308 Other specified dermatitis: Secondary | ICD-10-CM | POA: Diagnosis not present

## 2017-08-12 DIAGNOSIS — D229 Melanocytic nevi, unspecified: Secondary | ICD-10-CM | POA: Diagnosis not present

## 2017-08-12 DIAGNOSIS — L814 Other melanin hyperpigmentation: Secondary | ICD-10-CM | POA: Diagnosis not present

## 2017-08-12 DIAGNOSIS — L820 Inflamed seborrheic keratosis: Secondary | ICD-10-CM | POA: Diagnosis not present

## 2017-10-19 DIAGNOSIS — N401 Enlarged prostate with lower urinary tract symptoms: Secondary | ICD-10-CM | POA: Diagnosis not present

## 2017-10-19 DIAGNOSIS — R351 Nocturia: Secondary | ICD-10-CM | POA: Diagnosis not present

## 2017-10-19 DIAGNOSIS — J449 Chronic obstructive pulmonary disease, unspecified: Secondary | ICD-10-CM | POA: Diagnosis not present

## 2017-10-19 DIAGNOSIS — R739 Hyperglycemia, unspecified: Secondary | ICD-10-CM | POA: Diagnosis not present

## 2017-10-19 DIAGNOSIS — Z79899 Other long term (current) drug therapy: Secondary | ICD-10-CM | POA: Diagnosis not present

## 2017-10-19 DIAGNOSIS — F3342 Major depressive disorder, recurrent, in full remission: Secondary | ICD-10-CM | POA: Diagnosis not present

## 2017-11-12 DIAGNOSIS — L719 Rosacea, unspecified: Secondary | ICD-10-CM | POA: Diagnosis not present

## 2017-11-12 DIAGNOSIS — L301 Dyshidrosis [pompholyx]: Secondary | ICD-10-CM | POA: Diagnosis not present

## 2017-11-12 DIAGNOSIS — L720 Epidermal cyst: Secondary | ICD-10-CM | POA: Diagnosis not present

## 2017-11-12 DIAGNOSIS — L219 Seborrheic dermatitis, unspecified: Secondary | ICD-10-CM | POA: Diagnosis not present

## 2017-11-12 DIAGNOSIS — L821 Other seborrheic keratosis: Secondary | ICD-10-CM | POA: Diagnosis not present

## 2017-11-12 DIAGNOSIS — L308 Other specified dermatitis: Secondary | ICD-10-CM | POA: Diagnosis not present

## 2017-11-12 DIAGNOSIS — L820 Inflamed seborrheic keratosis: Secondary | ICD-10-CM | POA: Diagnosis not present

## 2018-02-19 DIAGNOSIS — R739 Hyperglycemia, unspecified: Secondary | ICD-10-CM | POA: Diagnosis not present

## 2018-02-19 DIAGNOSIS — Z79899 Other long term (current) drug therapy: Secondary | ICD-10-CM | POA: Diagnosis not present

## 2018-02-23 DIAGNOSIS — Z79899 Other long term (current) drug therapy: Secondary | ICD-10-CM | POA: Diagnosis not present

## 2018-02-23 DIAGNOSIS — L603 Nail dystrophy: Secondary | ICD-10-CM | POA: Diagnosis not present

## 2018-02-23 DIAGNOSIS — R351 Nocturia: Secondary | ICD-10-CM | POA: Diagnosis not present

## 2018-02-23 DIAGNOSIS — J449 Chronic obstructive pulmonary disease, unspecified: Secondary | ICD-10-CM | POA: Diagnosis not present

## 2018-02-23 DIAGNOSIS — R739 Hyperglycemia, unspecified: Secondary | ICD-10-CM | POA: Diagnosis not present

## 2018-02-23 DIAGNOSIS — N401 Enlarged prostate with lower urinary tract symptoms: Secondary | ICD-10-CM | POA: Diagnosis not present

## 2018-02-23 DIAGNOSIS — F3340 Major depressive disorder, recurrent, in remission, unspecified: Secondary | ICD-10-CM | POA: Diagnosis not present

## 2018-02-23 DIAGNOSIS — Z125 Encounter for screening for malignant neoplasm of prostate: Secondary | ICD-10-CM | POA: Diagnosis not present

## 2018-03-04 DIAGNOSIS — R918 Other nonspecific abnormal finding of lung field: Secondary | ICD-10-CM | POA: Diagnosis not present

## 2018-03-04 DIAGNOSIS — J449 Chronic obstructive pulmonary disease, unspecified: Secondary | ICD-10-CM | POA: Diagnosis not present

## 2018-03-04 DIAGNOSIS — R0602 Shortness of breath: Secondary | ICD-10-CM | POA: Diagnosis not present

## 2018-03-09 DIAGNOSIS — L720 Epidermal cyst: Secondary | ICD-10-CM | POA: Diagnosis not present

## 2018-03-16 DIAGNOSIS — L720 Epidermal cyst: Secondary | ICD-10-CM | POA: Diagnosis not present

## 2018-03-23 DIAGNOSIS — L720 Epidermal cyst: Secondary | ICD-10-CM | POA: Diagnosis not present

## 2018-03-25 DIAGNOSIS — R0602 Shortness of breath: Secondary | ICD-10-CM | POA: Diagnosis not present

## 2018-04-26 ENCOUNTER — Other Ambulatory Visit: Payer: Self-pay | Admitting: Specialist

## 2018-04-26 DIAGNOSIS — J849 Interstitial pulmonary disease, unspecified: Secondary | ICD-10-CM

## 2018-05-03 ENCOUNTER — Ambulatory Visit
Admission: RE | Admit: 2018-05-03 | Discharge: 2018-05-03 | Disposition: A | Discharge status: Home / Self Care | Payer: Medicare HMO | Source: Ambulatory Visit | Attending: Specialist | Admitting: Specialist

## 2018-05-03 DIAGNOSIS — J849 Interstitial pulmonary disease, unspecified: Secondary | ICD-10-CM | POA: Diagnosis not present

## 2018-05-18 ENCOUNTER — Other Ambulatory Visit: Payer: Self-pay | Admitting: Specialist

## 2018-05-18 DIAGNOSIS — J449 Chronic obstructive pulmonary disease, unspecified: Secondary | ICD-10-CM

## 2018-05-18 DIAGNOSIS — R0609 Other forms of dyspnea: Secondary | ICD-10-CM

## 2018-05-26 ENCOUNTER — Ambulatory Visit
Admission: RE | Admit: 2018-05-26 | Discharge: 2018-05-26 | Disposition: A | Discharge status: Home / Self Care | Payer: Medicare HMO | Source: Ambulatory Visit | Attending: Specialist | Admitting: Specialist

## 2018-05-26 DIAGNOSIS — J449 Chronic obstructive pulmonary disease, unspecified: Secondary | ICD-10-CM | POA: Diagnosis present

## 2018-05-26 DIAGNOSIS — D71 Functional disorders of polymorphonuclear neutrophils: Secondary | ICD-10-CM | POA: Diagnosis not present

## 2018-05-26 DIAGNOSIS — R0609 Other forms of dyspnea: Secondary | ICD-10-CM | POA: Diagnosis not present

## 2018-05-26 DIAGNOSIS — N132 Hydronephrosis with renal and ureteral calculous obstruction: Secondary | ICD-10-CM | POA: Diagnosis not present

## 2018-05-26 DIAGNOSIS — I2510 Atherosclerotic heart disease of native coronary artery without angina pectoris: Secondary | ICD-10-CM | POA: Diagnosis not present

## 2018-05-26 DIAGNOSIS — J439 Emphysema, unspecified: Secondary | ICD-10-CM | POA: Insufficient documentation

## 2018-05-26 DIAGNOSIS — I700 Atherosclerosis of aorta: Secondary | ICD-10-CM | POA: Insufficient documentation

## 2018-05-26 DIAGNOSIS — R9389 Abnormal findings on diagnostic imaging of other specified body structures: Secondary | ICD-10-CM | POA: Insufficient documentation

## 2018-05-26 DIAGNOSIS — K8020 Calculus of gallbladder without cholecystitis without obstruction: Secondary | ICD-10-CM | POA: Insufficient documentation

## 2018-05-26 LAB — GLUCOSE, CAPILLARY: GLUCOSE-CAPILLARY: 106 mg/dL — AB (ref 70–99)

## 2018-05-26 MED ORDER — FLUDEOXYGLUCOSE F - 18 (FDG) INJECTION
11.4000 | Freq: Once | INTRAVENOUS | Status: AC | PRN
  Administered 2018-05-26: 10.05 via INTRAVENOUS

## 2018-06-15 ENCOUNTER — Other Ambulatory Visit: Payer: Self-pay | Admitting: Specialist

## 2018-06-15 DIAGNOSIS — J849 Interstitial pulmonary disease, unspecified: Secondary | ICD-10-CM

## 2018-06-21 ENCOUNTER — Ambulatory Visit: Payer: Medicare HMO

## 2018-10-08 ENCOUNTER — Other Ambulatory Visit: Payer: Self-pay

## 2018-10-08 ENCOUNTER — Ambulatory Visit
Admission: RE | Admit: 2018-10-08 | Discharge: 2018-10-08 | Disposition: A | Discharge status: Home / Self Care | Payer: Medicare HMO | Source: Ambulatory Visit | Attending: Specialist | Admitting: Specialist

## 2018-10-08 DIAGNOSIS — J849 Interstitial pulmonary disease, unspecified: Secondary | ICD-10-CM

## 2018-12-11 ENCOUNTER — Encounter: Payer: Self-pay | Admitting: Gastroenterology
